# Patient Record
Sex: Female | Born: 1965 | Race: Black or African American | Hispanic: No | Marital: Married | Smoking: Never smoker
Health system: Southern US, Community
[De-identification: ages and names within clinical notes are randomized; demographics above are authoritative.]

## PROBLEM LIST (undated history)

## (undated) DIAGNOSIS — K635 Polyp of colon: Secondary | ICD-10-CM

## (undated) DIAGNOSIS — K219 Gastro-esophageal reflux disease without esophagitis: Secondary | ICD-10-CM

## (undated) HISTORY — PX: HERNIA REPAIR: SHX51

## (undated) HISTORY — PX: BREAST REDUCTION SURGERY: SHX8

## (undated) HISTORY — DX: Polyp of colon: K63.5

## (undated) HISTORY — PX: ABDOMINAL HYSTERECTOMY: SHX81

## (undated) HISTORY — PX: CHOLECYSTECTOMY: SHX55

## (undated) HISTORY — DX: Gastro-esophageal reflux disease without esophagitis: K21.9

---

## 2019-10-23 ENCOUNTER — Other Ambulatory Visit: Payer: Self-pay | Admitting: Nurse Practitioner

## 2019-10-23 DIAGNOSIS — M8580 Other specified disorders of bone density and structure, unspecified site: Secondary | ICD-10-CM

## 2019-10-26 ENCOUNTER — Other Ambulatory Visit: Payer: Self-pay | Admitting: Nurse Practitioner

## 2019-10-26 DIAGNOSIS — N644 Mastodynia: Secondary | ICD-10-CM

## 2019-11-09 ENCOUNTER — Ambulatory Visit
Admission: RE | Admit: 2019-11-09 | Discharge: 2019-11-09 | Disposition: A | Discharge status: Home / Self Care | Payer: Managed Care, Other (non HMO) | Source: Ambulatory Visit | Attending: Nurse Practitioner | Admitting: Nurse Practitioner

## 2019-11-09 ENCOUNTER — Other Ambulatory Visit: Payer: Self-pay

## 2019-11-09 DIAGNOSIS — N644 Mastodynia: Secondary | ICD-10-CM

## 2019-12-10 ENCOUNTER — Ambulatory Visit
Admission: RE | Admit: 2019-12-10 | Discharge: 2019-12-10 | Disposition: A | Discharge status: Home / Self Care | Payer: Managed Care, Other (non HMO) | Source: Ambulatory Visit | Attending: Nurse Practitioner | Admitting: Nurse Practitioner

## 2019-12-10 ENCOUNTER — Other Ambulatory Visit: Payer: Self-pay

## 2019-12-10 DIAGNOSIS — M8580 Other specified disorders of bone density and structure, unspecified site: Secondary | ICD-10-CM

## 2019-12-10 DIAGNOSIS — M85851 Other specified disorders of bone density and structure, right thigh: Secondary | ICD-10-CM | POA: Insufficient documentation

## 2020-03-04 ENCOUNTER — Other Ambulatory Visit: Payer: Self-pay | Admitting: Nurse Practitioner

## 2020-03-04 DIAGNOSIS — N644 Mastodynia: Secondary | ICD-10-CM

## 2020-03-13 ENCOUNTER — Ambulatory Visit
Admission: RE | Admit: 2020-03-13 | Discharge: 2020-03-13 | Disposition: A | Discharge status: Home / Self Care | Payer: PRIVATE HEALTH INSURANCE | Source: Ambulatory Visit | Attending: Nurse Practitioner | Admitting: Nurse Practitioner

## 2020-03-13 ENCOUNTER — Ambulatory Visit: Payer: Managed Care, Other (non HMO)

## 2020-03-13 ENCOUNTER — Other Ambulatory Visit: Payer: Self-pay

## 2020-03-13 DIAGNOSIS — N644 Mastodynia: Secondary | ICD-10-CM

## 2020-03-19 ENCOUNTER — Other Ambulatory Visit: Payer: Managed Care, Other (non HMO)

## 2020-04-22 DIAGNOSIS — Z8249 Family history of ischemic heart disease and other diseases of the circulatory system: Secondary | ICD-10-CM | POA: Insufficient documentation

## 2020-04-22 DIAGNOSIS — I10 Essential (primary) hypertension: Secondary | ICD-10-CM | POA: Insufficient documentation

## 2020-05-21 DIAGNOSIS — K219 Gastro-esophageal reflux disease without esophagitis: Secondary | ICD-10-CM | POA: Insufficient documentation

## 2020-05-21 DIAGNOSIS — J3089 Other allergic rhinitis: Secondary | ICD-10-CM | POA: Insufficient documentation

## 2020-05-29 ENCOUNTER — Ambulatory Visit: Payer: Managed Care, Other (non HMO) | Admitting: Family Medicine

## 2020-05-29 DIAGNOSIS — R0789 Other chest pain: Secondary | ICD-10-CM | POA: Insufficient documentation

## 2020-06-02 DIAGNOSIS — R221 Localized swelling, mass and lump, neck: Secondary | ICD-10-CM | POA: Insufficient documentation

## 2020-06-02 DIAGNOSIS — Z6837 Body mass index (BMI) 37.0-37.9, adult: Secondary | ICD-10-CM | POA: Insufficient documentation

## 2020-06-02 DIAGNOSIS — E559 Vitamin D deficiency, unspecified: Secondary | ICD-10-CM | POA: Insufficient documentation

## 2020-10-07 ENCOUNTER — Other Ambulatory Visit: Payer: Self-pay | Admitting: Otolaryngology

## 2020-10-07 ENCOUNTER — Other Ambulatory Visit: Payer: Self-pay | Admitting: Physician Assistant

## 2020-10-07 DIAGNOSIS — Z1231 Encounter for screening mammogram for malignant neoplasm of breast: Secondary | ICD-10-CM

## 2020-11-17 ENCOUNTER — Ambulatory Visit
Admission: RE | Admit: 2020-11-17 | Discharge: 2020-11-17 | Disposition: A | Discharge status: Home / Self Care | Payer: PRIVATE HEALTH INSURANCE | Source: Ambulatory Visit | Attending: Physician Assistant | Admitting: Physician Assistant

## 2020-11-17 ENCOUNTER — Other Ambulatory Visit: Payer: Self-pay

## 2020-11-17 DIAGNOSIS — Z1231 Encounter for screening mammogram for malignant neoplasm of breast: Secondary | ICD-10-CM

## 2020-11-24 ENCOUNTER — Other Ambulatory Visit: Payer: Self-pay | Admitting: Physician Assistant

## 2020-11-24 DIAGNOSIS — R928 Other abnormal and inconclusive findings on diagnostic imaging of breast: Secondary | ICD-10-CM

## 2020-12-05 ENCOUNTER — Ambulatory Visit
Admission: RE | Admit: 2020-12-05 | Discharge: 2020-12-05 | Disposition: A | Discharge status: Home / Self Care | Payer: PRIVATE HEALTH INSURANCE | Source: Ambulatory Visit | Attending: Physician Assistant | Admitting: Physician Assistant

## 2020-12-05 ENCOUNTER — Other Ambulatory Visit: Payer: Self-pay

## 2020-12-05 DIAGNOSIS — R928 Other abnormal and inconclusive findings on diagnostic imaging of breast: Secondary | ICD-10-CM

## 2021-03-13 ENCOUNTER — Telehealth: Payer: Self-pay

## 2021-03-13 NOTE — Telephone Encounter (Signed)
Referral notes sent from [facility], Phone #: [phone], Fax #: [phone]   Notes sent to scheduling

## 2021-03-16 ENCOUNTER — Telehealth: Payer: Self-pay

## 2021-03-16 NOTE — Telephone Encounter (Signed)
NOTES ON [place], SENT REFERRAL TO SCHEDULING

## 2021-04-24 ENCOUNTER — Other Ambulatory Visit: Payer: Self-pay

## 2021-04-24 DIAGNOSIS — E65 Localized adiposity: Secondary | ICD-10-CM | POA: Insufficient documentation

## 2021-04-27 ENCOUNTER — Encounter: Payer: Self-pay | Admitting: Gastroenterology

## 2021-04-27 ENCOUNTER — Ambulatory Visit (INDEPENDENT_AMBULATORY_CARE_PROVIDER_SITE_OTHER): Payer: 59 | Admitting: Gastroenterology

## 2021-04-27 VITALS — BP 124/68 | HR 64 | Ht 61.0 in | Wt 198.6 lb

## 2021-04-27 DIAGNOSIS — Z86010 Personal history of colonic polyps: Secondary | ICD-10-CM

## 2021-04-27 DIAGNOSIS — R12 Heartburn: Secondary | ICD-10-CM

## 2021-04-27 DIAGNOSIS — Z800 Family history of malignant neoplasm of digestive organs: Secondary | ICD-10-CM

## 2021-04-27 NOTE — Progress Notes (Addendum)
[facility] Consult Note:  History: Michelle Larsen 04/27/2021  Referring provider: Daphane Shepherd, PA  Reason for consult/chief complaint: Gastroesophageal Reflux (New to the area, here to establish care)   Subjective  HPI:  This is a very pleasant 55 year old woman referred by her new primary care provider for chronic upper digestive symptoms and history of colon polyps. She was living in [name] about 15 years ago when she had chronic abdominal pain and saw a GI physician.  EGD and colonoscopy reportedly done (no records) and she was started on Dexilant 60 mg daily at that time.  It is difficult for her to recall that far back, but she believes that she was probably having some heartburn at the time.  She was ultimately diagnosed with intra-abdominal adhesions and the pain resolved after surgery for that.  She remained on the [place] and says that every time it was stopped she would get recurrent heartburn.  The physician in [place], [place] was able to eventually get her down to 30 mg a day and she has remained on that for years.  She had a subsequent EGD sometime in [place]. [name] denies heartburn dysphagia odynophagia, nausea or vomiting.  Her bowel habits are regular without rectal bleeding.  Michelle Larsen on 03/05/2019: Reported history of colon polyps (unknown histology) on last colonoscopy more than 5 years prior, and unspecified family history colon cancer Small external hemorrhoids noted.  8 to 10 mm sigmoid colon sessile polyp removed with a hot snare (no pathology report with these records).  Sigmoid diverticulosis. ROS:  Review of Systems She denies chest pain dyspnea or dysuria  Past Medical History: Past Medical History:  Diagnosis Date  . Colon polyp   . GERD (gastroesophageal reflux disease)      Past Surgical History: Past Surgical History:  Procedure Laterality Date  . ABDOMINAL  HYSTERECTOMY    . BREAST REDUCTION SURGERY    . CESAREAN SECTION    . CHOLECYSTECTOMY    . HERNIA REPAIR     x2     Family History: Family History  Problem Relation Age of Onset  . Hypertension Mother   . Hypertension Father   . Colon cancer Father   . Other Father        Michelle Larsen   Father was diagnosed with colon cancer in his late 43s and did not previously been screened.  Social History: Social History   Socioeconomic History  . Marital status: Married    Spouse name: Not on file  . Number of children: Not on file  . Years of education: Not on file  . Highest education level: Not on file  Occupational History  . Not on file  Tobacco Use  . Smoking status: Never Smoker  . Smokeless tobacco: Never Used  Vaping Use  . Vaping Use: Never used  Substance and Sexual Activity  . Alcohol use: Not Currently  . Drug use: Never  . Sexual activity: Not on file  Other Topics Concern  . Not on file  Social History Narrative  . Not on file   Social Determinants of Health   Financial Resource Strain: Not on file  Food Insecurity: Not on file  Transportation Needs: Not on file  Physical Activity: Not on file  Stress: Not on file  Social Connections: Not on file    Allergies: Allergies  Allergen Reactions  . Sulfur Other (See Comments)    Sulfa antibiotics,  HCTZ  . Ceftin [Cefuroxime] Rash  . Sulfa Antibiotics Rash    Outpatient Meds: Current Outpatient Medications  Medication Sig Dispense Refill  . Dexlansoprazole (DEXILANT) 30 MG capsule Take 30 mg by mouth daily.    Marland Kitchen DIGESTIVE ENZYMES PO Take by mouth.    . Lactobacillus (PROBIOTIC ACIDOPHILUS PO) Take by mouth.    Marland Kitchen lisinopril (ZESTRIL) 20 MG tablet Take 1 tablet by mouth daily.     No current facility-administered medications for this visit.      ___________________________________________________________________ Objective   Exam:  BP 124/68   Pulse 64   Ht 5\' 1"  (1.549 m)   Wt 198 lb 9.6  oz (90.1 kg)   BMI 37.53 kg/m  Wt Readings from Last 3 Encounters:  04/27/21 198 lb 9.6 oz (90.1 kg)     General: Well-appearing, normal vocal  Eyes: sclera anicteric, no redness  ENT: oral mucosa moist without lesions, no cervical or supraclavicular lymphadenopathy  CV: RRR without murmur, S1/S2, no JVD, no peripheral edema  Resp: clear to auscultation bilaterally, normal RR and effort noted  GI: soft, no tenderness, with active bowel sounds. No guarding or palpable organomegaly noted.  Skin; warm and dry, no rash or jaundice noted  Neuro: awake, alert and oriented x 3. Normal gross motor function and fluent speech   Assessment: Encounter Diagnoses  Name Primary?  . History of colonic polyps Yes  . Family history of colon cancer in father   . Heartburn     Regarding her history of polyp and family history of colon cancer, it would be helpful to get the pathology report from her last procedure to know the specific type and exact size of polyp removed.  We will put her in for a 5-year recall for now, and it can be changed if necessary after receipt and review of the pathology report. Release of information form signed.  Regarding her chronic PPI use, it is not clear that it was initially started for GERD, though those records are currently available.  We discussed how patients can get rebound heartburn if long-term use of this medicine is stopped abruptly. While the long-term risks are uncertain, postmenopausal women could be at risk for impaired calcium absorption and bone loss, and she already has osteopenia.  Therefore, I recommended she decrease the Dexilant to 1 tablet every other day and send me a message by portal or call my nurse in about 2 weeks with an update.  We can then discuss further weaning of the medication afterward depending on how she is feeling.  Even changing to H2 blocker would be a reasonable goal if we cannot get her off acid suppression  altogether   Thank you for the courtesy of this consult.  Please call me with any questions or concerns.  Michelle Larsen  CC: Referring provider noted above  Addendum:  Records from prior endoscopist Michelle Larsen) include pathology report. -  tubular adenoma  Change recall to 3 years  - H. Loletha Carrow, MD

## 2021-04-27 NOTE — Patient Instructions (Signed)
If you are age 57 or older, your body mass index should be between 23-30. Your Body mass index is 37.53 kg/m. If this is out of the aforementioned range listed, please consider follow up with your Primary Care Provider.  If you are age 67 or younger, your body mass index should be between 19-25. Your Body mass index is 37.53 kg/m. If this is out of the aformentioned range listed, please consider follow up with your Primary Care Provider.   It was a pleasure to see you today!  Thank you for trusting me with your gastrointestinal care!

## 2021-06-03 DIAGNOSIS — Z86010 Personal history of colonic polyps: Secondary | ICD-10-CM | POA: Insufficient documentation

## 2021-06-03 DIAGNOSIS — Z800 Family history of malignant neoplasm of digestive organs: Secondary | ICD-10-CM | POA: Insufficient documentation

## 2021-06-03 DIAGNOSIS — D1803 Hemangioma of intra-abdominal structures: Secondary | ICD-10-CM | POA: Insufficient documentation

## 2021-07-10 ENCOUNTER — Other Ambulatory Visit: Payer: Self-pay

## 2021-07-10 ENCOUNTER — Ambulatory Visit (INDEPENDENT_AMBULATORY_CARE_PROVIDER_SITE_OTHER): Payer: 59 | Admitting: Cardiovascular Disease

## 2021-07-10 ENCOUNTER — Encounter (HOSPITAL_BASED_OUTPATIENT_CLINIC_OR_DEPARTMENT_OTHER): Payer: Self-pay | Admitting: Cardiovascular Disease

## 2021-07-10 DIAGNOSIS — Z6837 Body mass index (BMI) 37.0-37.9, adult: Secondary | ICD-10-CM

## 2021-07-10 DIAGNOSIS — R0602 Shortness of breath: Secondary | ICD-10-CM

## 2021-07-10 DIAGNOSIS — I10 Essential (primary) hypertension: Secondary | ICD-10-CM | POA: Diagnosis not present

## 2021-07-10 HISTORY — DX: Shortness of breath: R06.02

## 2021-07-10 MED ORDER — AMLODIPINE BESYLATE 2.5 MG PO TABS: 2.5000 mg | ORAL_TABLET | Freq: Every day | ORAL | 3 refills | Status: DC

## 2021-07-10 NOTE — Assessment & Plan Note (Signed)
Referral to PREP.

## 2021-07-10 NOTE — Progress Notes (Signed)
Cardiology Office Note:    Date:  07/10/2021   ID:  Michelle Larsen, DOB Jul 28, 1966, MRN [id number]  PCP:  Pcp, No   [facility] Providers Cardiologist:  None     Referring MD: Harrison Mons, PA   No chief complaint on file.   History of Present Illness:    Michelle Larsen is a 55 y.o. female with a hx of hypertension, GERD, and family hx of CAD, here as a referral for risk assessment.  Today, she states she is here to establish care. She was previously followed by [facility] cardi[facility] in [place], [place]. An echo about 15 years ago revealed grade 1 diastolic dysfunction. Occasionally she has palpitations and chest pain, and a recent Calcium Score was 0. Her palpitations are now occurring about once or twice a week, usually while she is lying in bed to go to sleep. At one time while working on her laptop she felt very lightheaded for a few minutes. Also, she endorses edema in all extremities. About 5 years ago she first started taking hypertension medication. She reports being allergic to HCTZ. While taking chorthalidone her blood pressure spiked and she needed to visit the ED. She was also on Norvasc at that time, which was switched to Lisinopril 5 mg. Lisinopril was later increased to 20 mg. At home her blood pressure is normally in the 120s/70-80s, and as the day progresses her systolic readings increase to the 130s. On average, her blood pressure is higher now than it was on Norvasc. In a typical day, she does not formally exercise. She states she procrastinates and has no motivation for exercise. 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 When she does exercise, she feels better with more energy. If she exerts herself too much while exercising, she will feel mild chest pain and shortness of breath. She knows she needs to lose more weight. Typically she makes her meals at home, and does not drink alcohol. She uses a probiotic and takes claritin daily. She denies any  headaches, syncope, orthopnea, or PND.   Past Medical History:  Diagnosis Date   Colon polyp    GERD (gastroesophageal reflux disease)    Shortness of breath 07/10/2021    Past Surgical History:  Procedure Laterality Date   ABDOMINAL HYSTERECTOMY     BREAST REDUCTION SURGERY     CESAREAN SECTION     CHOLECYSTECTOMY     HERNIA REPAIR     x2    Current Medications: Current Meds  Medication Sig   amLODipine (NORVASC) 2.5 MG tablet Take 1 tablet (2.5 mg total) by mouth daily.   Dexlansoprazole 30 MG capsule Take 30 mg by mouth daily.   Lactobacillus (PROBIOTIC ACIDOPHILUS PO) Take by mouth.   lisinopril (ZESTRIL) 20 MG tablet Take 1 tablet by mouth daily.   loratadine (CLARITIN) 10 MG tablet Take by mouth.     Allergies:   Sulfur, Chlorthalidone, Hctz [hydrochlorothiazide], Ceftin [cefuroxime], and Sulfa antibiotics   Social History   Socioeconomic History   Marital status: Married    Spouse name: Not on file   Number of children: Not on file   Years of education: Not on file   Highest education level: Not on file  Occupational History   Not on file  Tobacco Use   Smoking status: Never   Smokeless tobacco: Never  Vaping Use   Vaping Use: Never used  Substance and Sexual Activity   Alcohol use: Not Currently   Drug  use: Never   Sexual activity: Not on file  Other Topics Concern   Not on file  Social History Narrative   Not on file   Social Determinants of Health   Financial Resource Strain: Not on file  Food Insecurity: Not on file  Transportation Needs: Not on file  Physical Activity: Not on file  Stress: Not on file  Social Connections: Not on file     Family History: The patient's family history includes Atrial fibrillation in her mother; Colon cancer in her father; Heart disease in her paternal uncle; Heart failure in her maternal aunt and maternal uncle; Heart failure (age of onset: 59) in her maternal grandmother; Hypertension in her father and  mother; Other in her father.  ROS:   Please see the history of present illness.    (+) Palpitations (+) Chest pain (+) Lightheadedness (+) LE and UE edema (+) Exertional chest pain and shortness of breath All other systems reviewed and are negative.  EKGs/Labs/Other Studies Reviewed:    The following studies were reviewed today: No prior CV studies available.  EKG:   07/10/2021: Sinus rhythm. Rate 91 bpm. Left atrial enlargement.  Recent Labs: No results found for requested labs within last 8760 hours.  Recent Lipid Panel No results found for: CHOL, TRIG, HDL, CHOLHDL, VLDL, LDLCALC, LDLDIRECT       Physical Exam:    VS:  BP (!) 134/92 (BP Location: Left Arm, Patient Position: Sitting)   Pulse 91   Ht 5\' 1"  (1.549 m)   Wt 197 lb (89.4 kg)   SpO2 98%   BMI 37.22 kg/m  , BMI Body mass index is 37.22 kg/m. GENERAL:  Well appearing HEENT: Pupils equal round and reactive, fundi not visualized, oral mucosa unremarkable NECK:  No jugular venous distention, waveform within normal limits, carotid upstroke brisk and symmetric, no bruits LUNGS:  Clear to auscultation bilaterally HEART:  RRR.  PMI not displaced or sustained,S1 and S2 within normal limits, no S3, no S4, no clicks, no rubs, no murmurs ABD:  Flat, positive bowel sounds normal in frequency in pitch, no bruits, no rebound, no guarding, no midline pulsatile mass, no hepatomegaly, no splenomegaly EXT:  2 plus pulses throughout, no edema, no cyanosis no clubbing SKIN:  No rashes no nodules NEURO:  Cranial nerves II through XII grossly intact, motor grossly intact throughout PSYCH:  Cognitively intact, oriented to person place and time   ASSESSMENT:    1. Essential hypertension   2. BMI 37.0-37.9, adult   3. Shortness of breath    PLAN:   Essential hypertension Blood pressure is poorly controlled.  She notes that it was better when she was on amlodipine.  We will add back 2.5 mg daily.  Continue lisinopril.  She is  already doing a lot of good things like limiting her sodium intake.  We will ask her to work on increasing her exercise to at least 150 minutes a week.  We have referred her to the PREP exercise program to the [facility].  BMI 37.0-37.9, adult Referral to PREP.  Shortness of breath She has exertional dyspnea and intermittent lower extremity edema.  She attributes this to being out of shape and not exercising regularly.  However she also does have a strong family history of heart failure.  Is been over 15 years since she had an echo.  We will repeat her echo now.  Continue to work on blood pressure management as above.  Disposition: FU with PharmD in 1  month. FU with Nichoel Digiulio C. Oval Linsey, MD, [facility] in 4 months.   Medication Adjustments/Labs and Tests Ordered: Current medicines are reviewed at length with the patient today.  Concerns regarding medicines are outlined above.  Orders Placed This Encounter  Procedures   AMB Referral to [facility] Pharm-D   Amb Referral To Provider Referral Exercise Program (P.R.E.P)   ECHOCARDIOGRAM COMPLETE    Meds ordered this encounter  Medications   amLODipine (NORVASC) 2.5 MG tablet    Sig: Take 1 tablet (2.5 mg total) by mouth daily.    Dispense:  90 tablet    Refill:  3     Patient Instructions  Medication Instructions:  START AMLODIPINE 2.5 MG DAILY   *If you need a refill on your cardiac medications before your next appointment, please call your pharmacy*  Lab Work: NONE  Testing/Procedures: Your physician has requested that you have an echocardiogram. Echocardiography is a painless test that uses sound waves to create images of your heart. It provides your doctor with information about the size and shape of your heart and how well your heart's chambers and valves are working. This procedure takes approximately one hour. There are no restrictions for this procedure. [place] [address]  Follow-Up: At [facility], you  and your health needs are our priority.  As part of our continuing mission to provide you with exceptional heart care, we have created designated Provider Care Teams.  These Care Teams include your primary Cardiologist (physician) and Advanced Practice Providers (APPs -  Physician Assistants and Nurse Practitioners) who all work together to provide you with the care you need, when you need it.  We recommend signing up for the patient portal called "MyChart".  Sign up information is provided on this After Visit Summary.  MyChart is used to connect with patients for Virtual Visits (Telemedicine).  Patients are able to view lab/test results, encounter notes, upcoming appointments, etc.  Non-urgent messages can be sent to your provider as well.   To learn more about what you can do with MyChart, go to [email or web address].    Your next appointment:   4 month(s)  The format for your next appointment:   In Person  Provider:   Skeet Latch, MD or Laurann Montana, NP  Your physician recommends that you schedule a follow-up appointment in: [place] D   Other Instructions SOMEONE FROM THE [facility] PREP PROGRAM WILL BE IN TOUCH, IF YOU DO NOT HEAR IN 2 [place] UP    I,Mathew Stumpf,acting as a scribe for Skeet Latch, MD.,have documented all relevant documentation on the behalf of Skeet Latch, MD,as directed by  Skeet Latch, MD while in the presence of Skeet Latch, MD.  I, [place] Oval Linsey, MD have reviewed all documentation for this visit.  The documentation of the exam, diagnosis, procedures, and orders on 07/10/2021 are all accurate and complete.   Signed, Skeet Latch, MD  07/10/2021 5:59 PM    [facility]

## 2021-07-10 NOTE — Patient Instructions (Addendum)
Medication Instructions:  START AMLODIPINE 2.5 MG DAILY   *If you need a refill on your cardiac medications before your next appointment, please call your pharmacy*  Lab Work: NONE  Testing/Procedures: Your physician has requested that you have an echocardiogram. Echocardiography is a painless test that uses sound waves to create images of your heart. It provides your doctor with information about the size and shape of your heart and how well your heart's chambers and valves are working. This procedure takes approximately one hour. There are no restrictions for this procedure. [place] [address]  Follow-Up: At [facility], you and your health needs are our priority.  As part of our continuing mission to provide you with exceptional heart care, we have created designated Provider Care Teams.  These Care Teams include your primary Cardiologist (physician) and Advanced Practice Providers (APPs -  Physician Assistants and Nurse Practitioners) who all work together to provide you with the care you need, when you need it.  We recommend signing up for the patient portal called "MyChart".  Sign up information is provided on this After Visit Summary.  MyChart is used to connect with patients for Virtual Visits (Telemedicine).  Patients are able to view lab/test results, encounter notes, upcoming appointments, etc.  Non-urgent messages can be sent to your provider as well.   To learn more about what you can do with MyChart, go to [email or web address].    Your next appointment:   4 month(s)  The format for your next appointment:   In Person  Provider:   Skeet Latch, MD or Laurann Montana, NP  Your physician recommends that you schedule a follow-up appointment in: [address] [place] [address]   Other Instructions SOMEONE FROM THE [facility] PREP PROGRAM WILL BE IN TOUCH, IF YOU DO NOT HEAR IN 2 WEEKS CALL THE OFFICE FOR FOLLOW UP

## 2021-07-10 NOTE — Assessment & Plan Note (Signed)
Blood pressure is poorly controlled.  She notes that it was better when she was on amlodipine.  We will add back 2.5 mg daily.  Continue lisinopril.  She is already doing a lot of good things like limiting her sodium intake.  We will ask her to work on increasing her exercise to at least 150 minutes a week.  We have referred her to the PREP exercise program to the [facility].

## 2021-07-10 NOTE — Assessment & Plan Note (Signed)
She has exertional dyspnea and intermittent lower extremity edema.  She attributes this to being out of shape and not exercising regularly.  However she also does have a strong family history of heart failure.  Is been over 15 years since she had an echo.  We will repeat her echo now.  Continue to work on blood pressure management as above.

## 2021-07-15 NOTE — Addendum Note (Signed)
Addended by: Meryl Crutch on: 07/15/2021 08:29 AM   Modules accepted: Orders

## 2021-07-17 ENCOUNTER — Telehealth: Payer: Self-pay

## 2021-07-17 NOTE — Telephone Encounter (Signed)
Called to discuss PREP program, she wants to participate, prefers [facility], next class starts Aug 22, every M/W 10-11:15; will contact her early Aug to set up intake assessment appt for week of August 15.

## 2021-07-28 ENCOUNTER — Ambulatory Visit (HOSPITAL_COMMUNITY): Payer: 59 | Attending: Cardiology

## 2021-07-28 ENCOUNTER — Other Ambulatory Visit: Payer: Self-pay

## 2021-07-28 DIAGNOSIS — R0602 Shortness of breath: Secondary | ICD-10-CM | POA: Diagnosis present

## 2021-07-28 LAB — ECHOCARDIOGRAM COMPLETE
Area-P 1/2: 2.54 cm2
Calc EF: 65.2 %
S' Lateral: 2.6 cm
Single Plane A2C EF: 63.5 %
Single Plane A4C EF: 65.2 %

## 2021-08-05 ENCOUNTER — Telehealth: Payer: Self-pay

## 2021-08-05 NOTE — Telephone Encounter (Signed)
Intake assessment visit scheduled for Thursday August 18 at 11:30 at [facility]. To begin PREP classes every M/W 10-11:15 beginning August 22.

## 2021-08-11 ENCOUNTER — Other Ambulatory Visit: Payer: Self-pay

## 2021-08-11 ENCOUNTER — Ambulatory Visit (INDEPENDENT_AMBULATORY_CARE_PROVIDER_SITE_OTHER): Payer: 59 | Admitting: Pharmacist Clinician (PhC)/ Clinical Pharmacy Specialist

## 2021-08-11 DIAGNOSIS — I10 Essential (primary) hypertension: Secondary | ICD-10-CM

## 2021-08-11 NOTE — Assessment & Plan Note (Signed)
Patient with essential hypertension, doing well now on lisinopril and amlodipine.  Will have her continue with this regime for now.  Her goal is to get off medication by losing weight and will start PREP exercise program next week.  Praised her goal and advised that she let us know if her pressure drops significantly before her follow up with Dr. Oval Linsey in 3 months.

## 2021-08-11 NOTE — Progress Notes (Signed)
08/11/2021 [name] May 01, 1966 [id number]   HPI:  Michelle Larsen is a 55 y.o. female patient of Dr Oval Linsey, who presents today for advanced hypertension clinic management.  In addition to hypertension, her medical history is significant for seasonal allergies, and GERD.   She was seen by Dr. Oval Linsey last month and noted to have a pressure of 134/92.  At the time she was on lisinopril 20 mg.  Amlodipine 2.5 mg daily was added at that visit.    Today she returns for follow up.  She states that taking both the lisinopril and amlodipine at the same time causes her to feel drained/fatigued, and gives her chest pains.  She has done much better since moving the amlodipine to nights.  Has noted some lower extremity edema, but associates this with time spent outside in the heat/humidity.  Her PCP is currently slowly weaning her off dexlansoprazole, as she was recently diagnosed with osteopenia.    Blood Pressure Goal:  130/80  Current Medications: lisinopril 20 mg qam, amlodipine 2.5 mg qhs  Family Hx: both parents with late onset hypertension, both living at 42, 42, mother had short spell of AF, father had colon cancer, caught and treated; 1 sister, 2 brothers all controlled hypertension; one child, 24 with no issues  Social Hx: no tobacco, rare glass of wine,  no regular caffeine  Diet: home cooked meals, no added salt; good mix of meats and vegetables; more salads/veggies than meat, some beans for protein  Exercise: none, starts PREP next week  Home BP readings: home, 120/70's consistently throughout day since adding amlodipine  Intolerances: hctz - rash (sulfa sensitivity?), chlorthalidone caused upward spike in BP  Labs: 6/22 Na 142, BUN 13, SCr 1.01   Wt Readings from Last 3 Encounters:  08/11/21 201 lb (91.2 kg)  07/10/21 197 lb (89.4 kg)  04/27/21 198 lb 9.6 oz (90.1 kg)   BP Readings from Last 3 Encounters:  08/11/21 116/76  07/10/21 (!) 134/92  04/27/21 124/68   Pulse  Readings from Last 3 Encounters:  08/11/21 (!) 56  07/10/21 91  04/27/21 64    Current Outpatient Medications  Medication Sig Dispense Refill   amLODipine (NORVASC) 2.5 MG tablet Take 1 tablet (2.5 mg total) by mouth daily. 90 tablet 3   Dexlansoprazole 30 MG capsule Take 30 mg by mouth daily.     Lactobacillus (PROBIOTIC ACIDOPHILUS PO) Take by mouth.     lisinopril (ZESTRIL) 20 MG tablet Take 1 tablet by mouth daily.     loratadine (CLARITIN) 10 MG tablet Take by mouth.     No current facility-administered medications for this visit.    Allergies  Allergen Reactions   Sulfur Other (See Comments)    Sulfa antibiotics, HCTZ   Chlorthalidone Other (See Comments)    Elevated BP   Ceftin [Cefuroxime] Rash   Hctz [Hydrochlorothiazide] Rash   Sulfa Antibiotics Rash    Past Medical History:  Diagnosis Date   Colon polyp    GERD (gastroesophageal reflux disease)    Shortness of breath 07/10/2021    Blood pressure 116/76, pulse (!) 56, height '5\' 1"'$  (1.549 m), weight 201 lb (91.2 kg).  Essential hypertension Patient with essential hypertension, doing well now on lisinopril and amlodipine.  Will have her continue with this regime for now.  Her goal is to get off medication by losing weight and will start PREP exercise program next week.  Praised her goal and advised that she let us know if  her pressure drops significantly before her follow up with Dr. Oval Linsey in 3 months.     Tommy Medal PharmD CPP [facility] [address] [place], [place] [address] [phone]

## 2021-08-11 NOTE — Patient Instructions (Signed)
Return for a a follow up appointment November 16 with Dr. Oval Linsey at [facility] office  Check your blood pressure at home 3-4 times each week and keep record of the readings.  Take your BP meds as follows:  Continue with lisinopril and amlodipine  Bring all of your meds, your BP cuff and your record of home blood pressures to your next appointment.  Exercise as you're able, try to walk approximately 30 minutes per day.  Keep salt intake to a minimum, especially watch canned and prepared boxed foods.  Eat more fresh fruits and vegetables and fewer canned items.  Avoid eating in fast food restaurants.    HOW TO TAKE YOUR BLOOD PRESSURE: Rest 5 minutes before taking your blood pressure.  Don't smoke or drink caffeinated beverages for at least 30 minutes before. Take your blood pressure before (not after) you eat. Sit comfortably with your back supported and both feet on the floor (don't cross your legs). Elevate your arm to heart level on a table or a desk. Use the proper sized cuff. It should fit smoothly and snugly around your bare upper arm. There should be enough room to slip a fingertip under the cuff. The bottom edge of the cuff should be 1 inch above the crease of the elbow. Ideally, take 3 measurements at one sitting and record the average.

## 2021-08-13 NOTE — Progress Notes (Signed)
[place] Report   Patient Details  Name: Michelle Larsen MRN: [id number] Date of Birth: December 19, 1966 Age: 55 y.o. PCP: Pcp, No  Vitals:   08/13/21 1206  BP: 132/78  SpO2: 99%  Weight: 199 lb 12.8 oz (90.6 kg)      [facility] Eval - 08/13/21 1200       Referral    Referring Provider HTN   Oval Linsey   Reason for referral Hypertension;Inactivity;Obesitity/Overweight    Program Start Date 08/17/21      Measurement   Waist Circumference 43 inches    Hip Circumference 47.5 inches    Body fat 44.5 percent      Information for Trainer   Goals --   Establish an exercise routine; lose 10 pounds by end of 12 wk program   Current Exercise none    Orthopedic Concerns none    Pertinent Medical History HTN    Current Barriers work      Timed Up and Go (TUGS)   Timed Up and Go Low risk <9 seconds      Mobility and Daily Activities   I find it easy to walk up or down two or more flights of stairs. 3    I have no trouble taking out the trash. 4    I do housework such as vacuuming and dusting on my own without difficulty. 4    I can easily lift a gallon of milk (8lbs). 4    I can easily walk a mile. 4    I have no trouble reaching into high cupboards or reaching down to pick up something from the floor. 4    I do not have trouble doing out-door work such as Armed forces logistics/support/administrative officer, raking leaves, or gardening. 1      Mobility and Daily Activities   I feel younger than my age. 2    I feel independent. 4    I feel energetic. 3    I live an active life.  4    I feel strong. 4    I feel healthy. 3    I feel active as other people my age. 2      How fit and strong are you.   Fit and Strong Total Score 46            Past Medical History:  Diagnosis Date   Colon polyp    GERD (gastroesophageal reflux disease)    Shortness of breath 07/10/2021   Past Surgical History:  Procedure Laterality Date   ABDOMINAL HYSTERECTOMY     BREAST REDUCTION SURGERY     CESAREAN  SECTION     CHOLECYSTECTOMY     HERNIA REPAIR     x2   Social History   Tobacco Use  Smoking Status Never  Smokeless Tobacco Never    To begin PREP Class August 22 at [facility] every M/W 10-11:15am  Yevonne Aline 08/13/2021, 12:11 PM

## 2021-08-17 NOTE — Progress Notes (Signed)
[facility] PREP Weekly Session   Patient Details  Name: Michelle Larsen MRN: [id number] Date of Birth: 01-15-1966 Age: 55 y.o. PCP: Pcp, No  There were no vitals filed for this visit.   [facility] Weekly seesion - 08/17/21 1600       Weekly Session   Topic Discussed Goal setting and welcome to the program   Introductions, review of PREP notebook and program, tour of facility, light cardio workout followed by stretching             Ellenore Roscoe B Miaa Latterell 08/17/2021, 4:06 PM

## 2021-08-24 NOTE — Progress Notes (Signed)
[facility] PREP Weekly Session   Patient Details  Name: Michelle Larsen MRN: [id number] Date of Birth: October 01, 1966 Age: 55 y.o. PCP: Pcp, No  Vitals:   08/24/21 1301  Weight: 197 lb 9.6 oz (89.6 kg)     [facility] Weekly seesion - 08/24/21 1300       Weekly Session   Topic Discussed Importance of resistance training;Other ways to be active;Water    Minutes exercised this week 145 minutes    Classes attended to date 3           Healthy habits: cardio: 150 min/wk by end of 12 weeks; strength training: 2-3 times a week, 20-40 minutes each session by end of 12 wks; water: 64 oz a day, working up to 1/2 body wt in ounces by end of 12 wks.   Zachery Dakins Rosamaria Donn 08/24/2021, 1:04 PM

## 2021-08-31 NOTE — Progress Notes (Signed)
[facility] PREP Weekly Session   Patient Details  Name: Michelle Larsen MRN: [id number] Date of Birth: 01-23-1966 Age: 55 y.o. PCP: Pcp, No  Vitals:   08/31/21 1124  Weight: 196 lb 12.8 oz (89.3 kg)     [facility] Weekly seesion - 08/31/21 1100       Weekly Session   Topic Discussed Healthy eating tips   Honey food label review   Minutes exercised this week 210 minutes    Classes attended to date [place] 08/31/2021, 11:24 AM

## 2021-09-07 NOTE — Progress Notes (Signed)
[facility] PREP Weekly Session   Patient Details  Name: Michelle Larsen MRN: [id number] Date of Birth: March 12, 1966 Age: 55 y.o. PCP: Pcp, No  Vitals:   09/07/21 1138  Weight: 197 lb 6.4 oz (89.5 kg)     [facility] Weekly seesion - 09/07/21 1100       Weekly Session   Topic Discussed Health habits;Water   Sugar demo   Minutes exercised this week 200 minutes    Classes attended to date [place] 09/07/2021, 11:40 AM

## 2021-09-14 NOTE — Progress Notes (Signed)
[facility] PREP Weekly Session   Patient Details  Name: Michelle Larsen MRN: [id number] Date of Birth: 04-Apr-1966 Age: 55 y.o. PCP: Pcp, No  Vitals:   09/14/21 1356  Weight: 196 lb (88.9 kg)     [facility] Weekly seesion - 09/14/21 1300       Weekly Session   Topic Discussed Restaurant Eating    Minutes exercised this week 240 minutes    Classes attended to date [place] 09/14/2021, 1:57 PM

## 2021-10-05 NOTE — Progress Notes (Signed)
[facility] PREP Weekly Session  Patient Details  Name: Michelle Larsen MRN: [id number] Date of Birth: 05/12/1966 Age: 55 y.o. PCP: Pcp, No  Vitals:   10/05/21 1208  Weight: 197 lb (89.4 kg)     [facility] Weekly seesion - 10/05/21 1200       [facility] "PREP" Location   [facility] "PREP" Location [facility]      Weekly Session   Topic Discussed Expectations and non-scale victories    Minutes exercised this week 330 minutes    Classes attended to date [place] 10/05/2021, 12:09 PM

## 2021-10-12 NOTE — Progress Notes (Signed)
[facility] PREP Weekly Session  Patient Details  Name: Michelle Larsen MRN: [id number] Date of Birth: 10-Jul-1966 Age: 55 y.o. PCP: Pcp, No  Vitals:   10/12/21 1127  Weight: 196 lb (88.9 kg)     [facility] Weekly seesion - 10/12/21 1100       [facility] "PREP" Location   [facility] "PREP" Location [facility]      Weekly Session   Topic Discussed Other   Portion control, [employer] visualize your portion size demo, review of nutrition labels   Minutes exercised this week 240 minutes    Classes attended to date 94           Homework for next week: bring in health food nutrition label of a food consuming in the home.   Zachery Dakins Soriya Worster 10/12/2021, 11:28 AM

## 2021-10-19 ENCOUNTER — Telehealth: Payer: Self-pay

## 2021-10-19 NOTE — Telephone Encounter (Signed)
Incorrect documentation, phone call not made, documenting for PREP class.

## 2021-10-19 NOTE — Progress Notes (Signed)
[facility] PREP Weekly Session  Patient Details  Name: Michelle Larsen MRN: [id number] Date of Birth: 1966/08/18 Age: 55 y.o. PCP: Pcp, No  Vitals:   10/19/21 1103  Weight: 195 lb 6.4 oz (88.6 kg)     [facility] Weekly seesion - 10/19/21 1100       [facility] "PREP" Location   [facility] "PREP[facility] Location [facility]      Weekly Session   Topic Discussed Finding support   Review of food labels brought in by each partcipant of program   Minutes exercised this week 210 minutes    Classes attended to date [place] 10/19/2021, 11:05 AM

## 2021-10-26 NOTE — Progress Notes (Signed)
[facility] PREP Weekly Session  Patient Details  Name: Michelle Larsen MRN: [id number] Date of Birth: [birth year] Age: 55 y.o. PCP: Pcp, No  Vitals:   10/26/21 1137  Weight: 195 lb (88.5 kg)     [facility] Weekly seesion - 10/26/21 1100       [facility] "PREP" Location   [facility] "PREP" Location [facility]      Weekly Session   Topic Discussed Calorie breakdown   Fat/Muscle comparison   Minutes exercised this week 240 minutes    Classes attended to date [place] 10/26/2021, 11:38 AM

## 2021-11-02 NOTE — Progress Notes (Signed)
[facility] PREP Weekly Session  Patient Details  Name: Michelle Larsen MRN: [id number] Date of Birth: Jun 14, 1966 Age: 55 y.o. PCP: Pcp, No  Vitals:   11/02/21 1132  Weight: 196 lb (88.9 kg)     [facility] Weekly seesion - 11/02/21 1100       [facility] "PREP" Location   [facility] "PREP" Location [facility]      Weekly Session   Topic Discussed Hitting roadblocks   Membership talk with Merrilee Seashore; reviewed goals and activity plan for next 90 days, to bring to final assessment vist Wednesday 11/16   Minutes exercised this week 255 minutes    Classes attended to date [place] 11/02/2021, 11:46 AM

## 2021-11-04 ENCOUNTER — Other Ambulatory Visit: Payer: Self-pay | Admitting: Physician Assistant

## 2021-11-04 DIAGNOSIS — Z1231 Encounter for screening mammogram for malignant neoplasm of breast: Secondary | ICD-10-CM

## 2021-11-09 NOTE — Progress Notes (Signed)
[facility] PREP Weekly Session  Patient Details  Name: Michelle Larsen MRN: [id number] Date of Birth: [birth year] Age: 55 y.o. PCP: Pcp, No  Vitals:   11/09/21 1112  Weight: 196 lb (88.9 kg)     [facility] Weekly seesion - 11/09/21 1100       [facility] "PREP" Location   [facility] "PREP" Location [facility]      Weekly Session   Topic Discussed --   How fit and strong survery completed; fit testing completed; review of final assessment visit on Wednesday to include filling out and bringing goals and activity plan for next 90 days and PREP Class survey   Minutes exercised this week 250 minutes    Classes attended to date [place] 11/09/2021, 11:14 AM

## 2021-11-11 ENCOUNTER — Ambulatory Visit (HOSPITAL_BASED_OUTPATIENT_CLINIC_OR_DEPARTMENT_OTHER): Payer: 59 | Admitting: Cardiovascular Disease

## 2021-11-11 ENCOUNTER — Ambulatory Visit (INDEPENDENT_AMBULATORY_CARE_PROVIDER_SITE_OTHER): Payer: 59 | Admitting: Cardiovascular Disease

## 2021-11-11 ENCOUNTER — Encounter (HOSPITAL_BASED_OUTPATIENT_CLINIC_OR_DEPARTMENT_OTHER): Payer: Self-pay | Admitting: Cardiovascular Disease

## 2021-11-11 ENCOUNTER — Other Ambulatory Visit: Payer: Self-pay

## 2021-11-11 DIAGNOSIS — E65 Localized adiposity: Secondary | ICD-10-CM | POA: Diagnosis not present

## 2021-11-11 DIAGNOSIS — I10 Essential (primary) hypertension: Secondary | ICD-10-CM

## 2021-11-11 NOTE — Progress Notes (Signed)
[facility] PREP Evaluation  Patient Details  Name: Michelle Larsen MRN: [id number] Date of Birth: 12/31/1965 Age: 55 y.o. PCP: Pcp, No  Vitals:   11/11/21 1050  BP: 120/80  Pulse: 78  SpO2: 99%  Weight: 196 lb (88.9 kg)     [facility] Eval - 11/11/21 1000       [facility] "PREP" Location   [facility] "PREP" Location [facility]      Referral    Program Start Date --   08/17/2021-11/11/2021     Measurement   Waist Circumference 42 inches    Hip Circumference 46.5 inches    Body fat 43.8 percent      Mobility and Daily Activities   I find it easy to walk up or down two or more flights of stairs. 3    I have no trouble taking out the trash. 4    I do housework such as vacuuming and dusting on my own without difficulty. 4    I can easily lift a gallon of milk (8lbs). 4    I can easily walk a mile. 4    I have no trouble reaching into high cupboards or reaching down to pick up something from the floor. 4    I do not have trouble doing out-door work such as Armed forces logistics/support/administrative officer, raking leaves, or gardening. 4      Mobility and Daily Activities   I feel younger than my age. 3    I feel independent. 4    I feel energetic. 3    I live an active life.  4    I feel strong. 4    I feel healthy. 3    I feel active as other people my age. 4      How fit and strong are you.   Fit and Strong Total Score 52            Past Medical History:  Diagnosis Date   Colon polyp    GERD (gastroesophageal reflux disease)    Shortness of breath 07/10/2021   Past Surgical History:  Procedure Laterality Date   ABDOMINAL HYSTERECTOMY     BREAST REDUCTION SURGERY     CESAREAN SECTION     CHOLECYSTECTOMY     HERNIA REPAIR     x2   Social History   Tobacco Use  Smoking Status Never  Smokeless Tobacco Never  End of program assessment: workouts completed: 10 Education sessions completed: 11 Total wt loss: 3.8 lbs Inches lost: 2  Ritesh Opara B Percival Glasheen 11/11/2021, 10:52 AM

## 2021-11-11 NOTE — Progress Notes (Signed)
Cardiology Office Note:    Date:  11/11/2021   ID:  Michelle Larsen, DOB 05-18-1966, MRN [id number]  PCP:  Harrison Mons, PA   [facility] Providers Cardiologist:  None     Referring MD: No ref. provider found   No chief complaint on file.   History of Present Illness:    Michelle Larsen is a 55 y.o. female with a hx of hypertension, GERD, and family hx of CAD, here for follow-up. She was initially seen 07/10/2021 as a referral for risk assessment. She was previously followed by [facility] in [place], [place]. An echo about 15 years ago revealed grade 1 diastolic dysfunction. Occasionally she has palpitations and chest pain, and a recent Calcium Score was 0. She reported palpitations once or twice per week. Her blood pressure was above goal in office but had been controlled at home. Amlodipine 2.5 mg was added.  She reported atypical chest pain but none with exertion. She also reported some shortness of breath and edema. She had an Echo 07/2021 with LVEF 60-65% and grade 1 diastolic dysfunction. RA pressure was 3 mmHG. She followed up with our pharmacy 07/2021 and blood pressure was better controlled. She was referred to the PREP exercise program.  Today, she is feeling pretty good. Since her last visit, she does not believe her Norvasc is helping. After taking it by itself, she noticed chest discomfort after waking up in the morning. She has tried stopping it, and the chest discomfort did not occur until she again restarted the Norvasc. About 2 weeks ago she discontinued her Norvasc. She has completed the PREP program, and this has motivated her to start exercising. She will start following the 5 small meal plan for her diet. She denies any palpitations, or shortness of breath. No lightheadedness, headaches, syncope, orthopnea, PND, lower extremity edema or exertional symptoms.  Prior Antihypertensives: HCTZ Chlorthalidone Amlodipine - Chest discomfort  Past Medical  History:  Diagnosis Date   Colon polyp    GERD (gastroesophageal reflux disease)    Shortness of breath 07/10/2021    Past Surgical History:  Procedure Laterality Date   ABDOMINAL HYSTERECTOMY     BREAST REDUCTION SURGERY     CESAREAN SECTION     CHOLECYSTECTOMY     HERNIA REPAIR     x2    Current Medications: Current Meds  Medication Sig   Dexlansoprazole 30 MG capsule Take 30 mg by mouth daily.   Lactobacillus (PROBIOTIC ACIDOPHILUS PO) Take by mouth.   lisinopril (ZESTRIL) 20 MG tablet Take 1 tablet by mouth daily.   loratadine (CLARITIN) 10 MG tablet Take by mouth.     Allergies:   Sulfur, Amlodipine, Chlorthalidone, Ceftin [cefuroxime], Hctz [hydrochlorothiazide], and Sulfa antibiotics   Social History   Socioeconomic History   Marital status: Married    Spouse name: Not on file   Number of children: Not on file   Years of education: Not on file   Highest education level: Not on file  Occupational History   Not on file  Tobacco Use   Smoking status: Never   Smokeless tobacco: Never  Vaping Use   Vaping Use: Never used  Substance and Sexual Activity   Alcohol use: Not Currently   Drug use: Never   Sexual activity: Not on file  Other Topics Concern   Not on file  Social History Narrative   Not on file   Social Determinants of Health   Financial Resource Strain: Not on file  Food  Insecurity: Not on file  Transportation Needs: Not on file  Physical Activity: Not on file  Stress: Not on file  Social Connections: Not on file     Family History: The patient's family history includes Atrial fibrillation in her mother; Colon cancer in her father; Heart disease in her paternal uncle; Heart failure in her maternal aunt and maternal uncle; Heart failure (age of onset: 20) in her maternal grandmother; Hypertension in her father and mother; Other in her father.  ROS:   Please see the history of present illness.    All other systems reviewed and are  negative.  EKGs/Labs/Other Studies Reviewed:    The following studies were reviewed today:  Echo 07/28/2021:  1. Left ventricular ejection fraction, by estimation, is 60 to 65%. The  left ventricle has normal function. The left ventricle has no regional  wall motion abnormalities. Left ventricular diastolic parameters are  consistent with Grade I diastolic  dysfunction (impaired relaxation). The average left ventricular global  longitudinal strain is -24.7 %. The global longitudinal strain is normal.   2. Right ventricular systolic function is normal. The right ventricular  size is normal. Tricuspid regurgitation signal is inadequate for assessing  PA pressure.   3. The mitral valve is normal in structure. No evidence of mitral valve  regurgitation. No evidence of mitral stenosis.   4. The aortic valve is tricuspid. Aortic valve regurgitation is not  visualized. No aortic stenosis is present.   5. The inferior vena cava is normal in size with greater than 50%  respiratory variability, suggesting right atrial pressure of 3 mmHg.   Calcium Score ([facility]) 06/02/2020: TOTAL CALCIUM SCORE :   0   CARDIAC ANATOMY:Normal.   VISUALIZED THORAX:The visualized mediastinum, lungs, and osseous structures are unremarkable. Hepatic cysts are noted within the visualized upper abdomen.   IMPRESSION: The total coronary calcium score is 0.   EKG:   11/11/2021: Sinus rhythm. Rate 70 bpm. 07/10/2021: Sinus rhythm. Rate 91 bpm. Left atrial enlargement.  Recent Labs: No results found for requested labs within last 8760 hours.   Recent Lipid Panel No results found for: CHOL, TRIG, HDL, CHOLHDL, VLDL, LDLCALC, LDLDIRECT       Physical Exam:    VS:  BP 120/88 (BP Location: Right Arm, Patient Position: Sitting, Cuff Size: Large)   Pulse 70  , BMI There is no height or weight on file to calculate BMI. GENERAL:  Well appearing HEENT: Pupils equal round and reactive, fundi not visualized, oral mucosa  unremarkable NECK:  No jugular venous distention, waveform within normal limits, carotid upstroke brisk and symmetric, no bruits LUNGS:  Clear to auscultation bilaterally HEART:  RRR.  PMI not displaced or sustained,S1 and S2 within normal limits, no S3, no S4, no clicks, no rubs, no murmurs ABD:  Flat, positive bowel sounds normal in frequency in pitch, no bruits, no rebound, no guarding, no midline pulsatile mass, no hepatomegaly, no splenomegaly EXT:  2 plus pulses throughout, no edema, no cyanosis no clubbing SKIN:  No rashes no nodules NEURO:  Cranial nerves II through XII grossly intact, motor grossly intact throughout PSYCH:  Cognitively intact, oriented to person place and time   ASSESSMENT:    1. Essential hypertension   2. Central obesity     PLAN:    Essential hypertension Blood pressure has been well-controlled.  She started taking amlodipine but felt poorly when taking it.  Blood pressures have been stable so she will just continue on lisinopril.  Continue  with regular exercise.  Central obesity She is congratulated on completing the PR EP program the [facility].  She enjoyed it and is not exercising regularly.  She was encouraged to keep getting at least 150 minutes of exercise weekly.  She will come back for fasting lipids and a CMP.   Disposition: FU with Tarl Cephas C. Oval Linsey, MD, [facility] in 1 year.   Medication Adjustments/Labs and Tests Ordered: Current medicines are reviewed at length with the patient today.  Concerns regarding medicines are outlined above.   Orders Placed This Encounter  Procedures   Lipid panel   Comprehensive metabolic panel   EKG 11-DBZM    No orders of the defined types were placed in this encounter.  Patient Instructions  Medication Instructions:  Your physician recommends that you continue on your current medications as directed. Please refer to the Current Medication list given to you today.   *If you need a refill on your cardiac  medications before your next appointment, please call your pharmacy*  Lab Work: FASTING LP/CMET SOON   Testing/Procedures: NONE  Follow-Up: At [facility], you and your health needs are our priority.  As part of our continuing mission to provide you with exceptional heart care, we have created designated Provider Care Teams.  These Care Teams include your primary Cardiologist (physician) and Advanced Practice Providers (APPs -  Physician Assistants and Nurse Practitioners) who all work together to provide you with the care you need, when you need it.  We recommend signing up for the patient portal called "MyChart".  Sign up information is provided on this After Visit Summary.  MyChart is used to connect with patients for Virtual Visits (Telemedicine).  Patients are able to view lab/test results, encounter notes, upcoming appointments, etc.  Non-urgent messages can be sent to your provider as well.   To learn more about what you can do with MyChart, go to [email or web address].    Your next appointment:   12 month(s)  The format for your next appointment:   In Person  Provider:   Skeet Latch, MD{     I,Mathew Stumpf,acting as a scribe for Skeet Latch, MD.,have documented all relevant documentation on the behalf of Skeet Latch, MD,as directed by  Skeet Latch, MD while in the presence of Skeet Latch, MD.  I, [place] Oval Linsey, MD have reviewed all documentation for this visit.  The documentation of the exam, diagnosis, procedures, and orders on 11/11/2021 are all accurate and complete.  Signed, Skeet Latch, MD  11/11/2021 12:47 PM    [facility]

## 2021-11-11 NOTE — Assessment & Plan Note (Signed)
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  She enjoyed it and is not exercising regularly.  She was encouraged to keep getting at least 150 minutes of exercise weekly.  She will come back for fasting lipids and a CMP.

## 2021-11-11 NOTE — Patient Instructions (Addendum)
Medication Instructions:  Your physician recommends that you continue on your current medications as directed. Please refer to the Current Medication list given to you today.   *If you need a refill on your cardiac medications before your next appointment, please call your pharmacy*  Lab Work: FASTING LP/CMET SOON   Testing/Procedures: NONE  Follow-Up: At [facility], you and your health needs are our priority.  As part of our continuing mission to provide you with exceptional heart care, we have created designated Provider Care Teams.  These Care Teams include your primary Cardiologist (physician) and Advanced Practice Providers (APPs -  Physician Assistants and Nurse Practitioners) who all work together to provide you with the care you need, when you need it.  We recommend signing up for the patient portal called "MyChart".  Sign up information is provided on this After Visit Summary.  MyChart is used to connect with patients for Virtual Visits (Telemedicine).  Patients are able to view lab/test results, encounter notes, upcoming appointments, etc.  Non-urgent messages can be sent to your provider as well.   To learn more about what you can do with MyChart, go to [email or web address].    Your next appointment:   12 month(s)  The format for your next appointment:   In Person  Provider:   Tiffany [employer], MD          

## 2021-11-11 NOTE — Assessment & Plan Note (Signed)
Blood pressure has been well-controlled.  She started taking amlodipine but felt poorly when taking it.  Blood pressures have been stable so she will just continue on lisinopril.  Continue with regular exercise.

## 2021-11-12 LAB — LIPID PANEL
Chol/HDL Ratio: 4.4 ratio (ref 0.0–4.4)
Cholesterol, Total: 180 mg/dL (ref 100–199)
HDL: 41 mg/dL (ref >39)
LDL Chol Calc (NIH): 121 mg/dL — ABNORMAL HIGH (ref 0–99)
Triglycerides: 99 mg/dL (ref 0–149)
VLDL Cholesterol Cal: 18 mg/dL (ref 5–40)

## 2021-11-12 LAB — COMPREHENSIVE METABOLIC PANEL
ALT: 9 IU/L (ref 0–32)
AST: 13 IU/L (ref 0–40)
Albumin/Globulin Ratio: 1.8 (ref 1.2–2.2)
Albumin: 4.4 g/dL (ref 3.8–4.9)
Alkaline Phosphatase: 87 IU/L (ref 44–121)
BUN/Creatinine Ratio: 11 (ref 9–23)
BUN: 12 mg/dL (ref 6–24)
Bilirubin Total: 0.3 mg/dL (ref 0.0–1.2)
CO2: 21 mmol/L (ref 20–29)
Calcium: 9.1 mg/dL (ref 8.7–10.2)
Chloride: 107 mmol/L — ABNORMAL HIGH (ref 96–106)
Creatinine, Ser: 1.11 mg/dL — ABNORMAL HIGH (ref 0.57–1.00)
Globulin, Total: 2.5 g/dL (ref 1.5–4.5)
Glucose: 93 mg/dL (ref 70–99)
Potassium: 5.2 mmol/L (ref 3.5–5.2)
Sodium: 145 mmol/L — ABNORMAL HIGH (ref 134–144)
Total Protein: 6.9 g/dL (ref 6.0–8.5)
eGFR: 58 mL/min/{1.73_m2} — ABNORMAL LOW (ref >59)

## 2021-12-08 ENCOUNTER — Ambulatory Visit
Admission: RE | Admit: 2021-12-08 | Discharge: 2021-12-08 | Disposition: A | Discharge status: Home / Self Care | Payer: No Typology Code available for payment source | Source: Ambulatory Visit | Attending: Physician Assistant | Admitting: Physician Assistant

## 2021-12-08 DIAGNOSIS — Z1231 Encounter for screening mammogram for malignant neoplasm of breast: Secondary | ICD-10-CM

## 2022-02-21 ENCOUNTER — Encounter (HOSPITAL_BASED_OUTPATIENT_CLINIC_OR_DEPARTMENT_OTHER): Payer: Self-pay | Admitting: Emergency Medicine

## 2022-02-21 ENCOUNTER — Other Ambulatory Visit: Payer: Self-pay

## 2022-02-21 ENCOUNTER — Emergency Department (HOSPITAL_BASED_OUTPATIENT_CLINIC_OR_DEPARTMENT_OTHER)
Admission: EM | Admit: 2022-02-21 | Discharge: 2022-02-21 | Disposition: A | Discharge status: Home / Self Care | Payer: No Typology Code available for payment source | Attending: Emergency Medicine | Admitting: Emergency Medicine

## 2022-02-21 ENCOUNTER — Emergency Department (HOSPITAL_BASED_OUTPATIENT_CLINIC_OR_DEPARTMENT_OTHER): Payer: No Typology Code available for payment source

## 2022-02-21 DIAGNOSIS — R079 Chest pain, unspecified: Secondary | ICD-10-CM

## 2022-02-21 DIAGNOSIS — I10 Essential (primary) hypertension: Secondary | ICD-10-CM | POA: Diagnosis not present

## 2022-02-21 DIAGNOSIS — R110 Nausea: Secondary | ICD-10-CM | POA: Insufficient documentation

## 2022-02-21 DIAGNOSIS — R5383 Other fatigue: Secondary | ICD-10-CM | POA: Diagnosis not present

## 2022-02-21 DIAGNOSIS — R0789 Other chest pain: Secondary | ICD-10-CM | POA: Diagnosis present

## 2022-02-21 DIAGNOSIS — R0602 Shortness of breath: Secondary | ICD-10-CM | POA: Diagnosis not present

## 2022-02-21 DIAGNOSIS — R0781 Pleurodynia: Secondary | ICD-10-CM | POA: Diagnosis not present

## 2022-02-21 LAB — HEPATIC FUNCTION PANEL
ALT: 13 U/L (ref 0–44)
AST: 14 U/L — ABNORMAL LOW (ref 15–41)
Albumin: 4.2 g/dL (ref 3.5–5.0)
Alkaline Phosphatase: 62 U/L (ref 38–126)
Bilirubin, Direct: <0.1 mg/dL (ref 0.0–0.2)
Total Bilirubin: 0.3 mg/dL (ref 0.3–1.2)
Total Protein: 7.2 g/dL (ref 6.5–8.1)

## 2022-02-21 LAB — BASIC METABOLIC PANEL
Anion gap: 12 (ref 5–15)
BUN: 15 mg/dL (ref 6–20)
CO2: 25 mmol/L (ref 22–32)
Calcium: 9.4 mg/dL (ref 8.9–10.3)
Chloride: 104 mmol/L (ref 98–111)
Creatinine, Ser: 1.07 mg/dL — ABNORMAL HIGH (ref 0.44–1.00)
GFR, Estimated: >60 mL/min (ref >60)
Glucose, Bld: 109 mg/dL — ABNORMAL HIGH (ref 70–99)
Potassium: 3.6 mmol/L (ref 3.5–5.1)
Sodium: 141 mmol/L (ref 135–145)

## 2022-02-21 LAB — CBC
HCT: 40.2 % (ref 36.0–46.0)
Hemoglobin: 12.6 g/dL (ref 12.0–15.0)
MCH: 26.3 pg (ref 26.0–34.0)
MCHC: 31.3 g/dL (ref 30.0–36.0)
MCV: 83.8 fL (ref 80.0–100.0)
Platelets: 279 10*3/uL (ref 150–400)
RBC: 4.8 MIL/uL (ref 3.87–5.11)
RDW: 14.8 % (ref 11.5–15.5)
WBC: 8.8 10*3/uL (ref 4.0–10.5)
nRBC: 0 % (ref 0.0–0.2)

## 2022-02-21 LAB — D-DIMER, QUANTITATIVE: D-Dimer, Quant: 0.3 ug/mL-FEU (ref 0.00–0.50)

## 2022-02-21 LAB — BRAIN NATRIURETIC PEPTIDE: B Natriuretic Peptide: 18.9 pg/mL (ref 0.0–100.0)

## 2022-02-21 LAB — PREGNANCY, URINE: Preg Test, Ur: NEGATIVE

## 2022-02-21 LAB — TROPONIN I (HIGH SENSITIVITY)
Troponin I (High Sensitivity): 4 ng/L (ref <18)
Troponin I (High Sensitivity): 3 ng/L (ref <18)

## 2022-02-21 LAB — LIPASE, BLOOD: Lipase: 46 U/L (ref 11–51)

## 2022-02-21 NOTE — ED Notes (Signed)
Lab notified of add on blood work // will collect d-dimer

## 2022-02-21 NOTE — ED Provider Notes (Signed)
[facility]ERG[facility] DEPT Provider Note   CSN: [id number] Arrival date & time: 02/21/22  2031     History  Chief Complaint  Patient presents with   Chest Pain    Michelle Larsen is a 56 y.o. female.  The history is provided by the patient, the spouse and medical records. No language interpreter was used.  Chest Pain Pain location:  L chest Pain quality: aching and radiating   Pain radiates to:  L shoulder and L arm Pain severity:  Moderate Onset quality:  Gradual Duration:  2 days Timing:  Intermittent Progression:  Waxing and waning Chronicity:  Recurrent Context: not eating and not trauma   Relieved by:  Nothing Worsened by:  Deep breathing and exertion Ineffective treatments:  None tried Associated symptoms: fatigue, nausea, palpitations and shortness of breath   Associated symptoms: no abdominal pain, no altered mental status, no back pain, no cough, no diaphoresis, no dizziness, no fever, no headache, no lower extremity edema, no near-syncope, no numbness, no vomiting and no weakness       Home Medications Prior to Admission medications   Medication Sig Start Date End Date Taking? Authorizing Provider  Dexlansoprazole 30 MG capsule Take 30 mg by mouth daily.    [provider]  Lactobacillus (PROBIOTIC ACIDOPHILUS PO) Take by mouth.    [provider]  lisinopril (ZESTRIL) 20 MG tablet Take 1 tablet by mouth daily. 02/27/21   [provider]  loratadine (CLARITIN) 10 MG tablet Take by mouth. 12/28/19   [provider]      Allergies    Sulfur, Amlodipine, Chlorthalidone, Ceftin [cefuroxime], Hctz [hydrochlorothiazide], and Sulfa antibiotics    Review of Systems   Review of Systems  Constitutional:  Positive for fatigue. Negative for chills, diaphoresis and fever.  HENT:  Negative for congestion.   Respiratory:  Positive for shortness of breath. Negative for cough, chest tightness and wheezing.   Cardiovascular:   Positive for chest pain and palpitations. Negative for leg swelling and near-syncope.  Gastrointestinal:  Positive for nausea. Negative for abdominal distention, abdominal pain, constipation, diarrhea and vomiting.  Genitourinary:  Negative for dysuria and flank pain.  Musculoskeletal:  Negative for back pain, neck pain and neck stiffness.  Skin:  Negative for rash and wound.  Neurological:  Positive for light-headedness. Negative for dizziness, weakness, numbness and headaches.  Psychiatric/Behavioral:  Negative for agitation and confusion.   All other systems reviewed and are negative.  Physical Exam Updated Vital Signs BP (!) 168/100 (BP Location: Right Arm)    Pulse 88    Temp 99.5 F (37.5 C)    Resp 18    Wt 90.7 kg    SpO2 100%    BMI 37.79 kg/m  Physical Exam Vitals and nursing note reviewed.  Constitutional:      General: She is not in acute distress.    Appearance: She is well-developed. She is not ill-appearing, toxic-appearing or diaphoretic.  HENT:     Head: Normocephalic and atraumatic.  Eyes:     Conjunctiva/sclera: Conjunctivae normal.     Pupils: Pupils are equal, round, and reactive to light.  Cardiovascular:     Rate and Rhythm: Normal rate and regular rhythm.     Heart sounds: Normal heart sounds. No murmur heard. Pulmonary:     Effort: Pulmonary effort is normal. No tachypnea or respiratory distress.     Breath sounds: Normal breath sounds. No decreased breath sounds, wheezing, rhonchi or rales.  Chest:  Chest wall: Tenderness present.  Abdominal:     Palpations: Abdomen is soft.     Tenderness: There is no abdominal tenderness.  Musculoskeletal:        General: No swelling.     Cervical back: Neck supple.     Right lower leg: No tenderness. No edema.     Left lower leg: No tenderness. No edema.  Skin:    General: Skin is warm and dry.     Capillary Refill: Capillary refill takes less than 2 seconds.  Neurological:     General: No focal deficit  present.     Mental Status: She is alert.  Psychiatric:        Mood and Affect: Mood normal. Mood is not anxious.    ED Results / Procedures / Treatments   Labs (all labs ordered are listed, but only abnormal results are displayed) Labs Reviewed  BASIC METABOLIC PANEL - Abnormal; Notable for the following components:      Result Value   Glucose, Bld 109 (*)    Creatinine, Ser 1.07 (*)    All other components within normal limits  HEPATIC FUNCTION PANEL - Abnormal; Notable for the following components:   AST 14 (*)    All other components within normal limits  CBC  PREGNANCY, URINE  LIPASE, BLOOD  D-DIMER, QUANTITATIVE  BRAIN NATRIURETIC PEPTIDE  TROPONIN I (HIGH SENSITIVITY)  TROPONIN I (HIGH SENSITIVITY)    EKG EKG Interpretation  Date/Time:  Sunday February 21 2022 20:44:05 EST Ventricular Rate:  85 PR Interval:  148 QRS Duration: 76 QT Interval:  364 QTC Calculation: 433 R Axis:   39 Text Interpretation: Normal sinus rhythm Cannot rule out Anterior infarct , age undetermined Abnormal ECG no prior ECG for comparison No STEMI Confirmed by Antony Blackbird [phone]) on 02/21/2022 9:19:10 PM  Radiology DG Chest Port 1 View  Result Date: 02/21/2022 CLINICAL DATA:  Chest pressure, nausea, left upper extremity pain and dizziness. EXAM: PORTABLE CHEST 1 VIEW COMPARISON:  None. FINDINGS: The heart size and mediastinal contours are within normal limits apart from slight aortic tortuosity. Both lungs are clear. The visualized skeletal structures are unremarkable. IMPRESSION: No evidence of acute chest disease. Electronically Signed   By: Telford Nab M.D.   On: 02/21/2022 21:35    Procedures Procedures    Medications Ordered in ED Medications - No data to display  ED Course/ Medical Decision Making/ A&P                           Medical Decision Making Amount and/or Complexity of Data Reviewed Labs: ordered. Radiology: ordered.    Michelle Larsen is a 56 y.o. female  with a past medical history significant for hypertension, previous hysterectomy, obesity, previous diastolic dysfunction, and allergies who presents with near syncope, chest pain, shortness of breath, and palpitations.  According to patient, for the last 2 days, she has had episodes of chest pressure and chest pain.  She reports that goes down her left arm and seems to get exertional.  She reports some nausea but no vomiting.  She got very lightheaded and near syncopal but did not have reported spinning dizziness.  She has felt tired but denies new medication changes.  She denies any fevers, chills, congestion, or cough.  She reports she was recently on Augmentin for sinus infection and has had some diarrhea with it but no constipation.  No other complaints reported.  EKG on arrival  does not show STEMI.  On exam, chest was slightly tender to palpation.  Lungs were clear however.  No murmur.  Good pulses in extremities.  Legs nontender and nonedematous.  She denies history of DVT or PE.  No focal neurologic deficits.  Back nontender.  Abdomen nontender.  Clinically I do feel need to rule out many etiologies of her discomfort.  With her age over 49 and pleuritic chest discomfort, I added a D-dimer to rule out thromboembolic etiology.  As patient reports history of shortness of breath, exertional pain, and history of diastolic dysfunction, will add on a BNP.  We will get screening labs and chest x-ray.  We will continue to monitor on telemetry.  Patient says that she does see Skeet Latch MD for her cardiac care and has had previous heart cath that was reassuring and calcium score that was reassuring as well.  If work-up is reassuring today, anticipate she may be candidate for discharge home with close cardiology follow-up.  If symptoms return or she has concerning findings, anticipate discussion with cardiology as she is established with Dr. Oval Linsey.  Anticipate reassessment after work-up  Work-up  returned overall reassuring.  Troponin negative x2, dimer negative, chest x-ray shows some tortuosity but otherwise no acute abnormality.  Labs otherwise reassuring.  Heart score returned at 5.  Had a shared decision made conversation about consultation with cardiology versus calling her cardiologist tomorrow given the reassuring work-up and previous reassuring work-up with cardiology.  Clinically I feel patient safe for discharge home and she agrees.  We will have her call her cardiologist tomorrow to discuss close follow-up and understands return precautions.  She had no other questions or concerns and was discharged in good condition.         Final Clinical Impression(s) / ED Diagnoses Final diagnoses:  Chest pain, unspecified type    Rx / DC Orders ED Discharge Orders     None       Clinical Impression: 1. Chest pain, unspecified type   2. Chest pain     Disposition: Discharge  Condition: Good  I have discussed the results, Dx and Tx plan with the pt(& family if present). He/she/they expressed understanding and agree(s) with the plan. Discharge instructions discussed at great length. Strict return precautions discussed and pt &/or family have verbalized understanding of the instructions. No further questions at time of discharge.    New Prescriptions   No medications on file    Follow Up: Skeet Latch, MD [address] [place] [address] [phone]     [facility] [place] [address] [phone]    [facility], [address] [place] [address] [phone]         Courtland Coppa, Gwenyth Allegra, MD 02/21/22 [phone]

## 2022-02-21 NOTE — Discharge Instructions (Signed)
Your work-up today was overall reassuring.  The heart enzymes were negative both times we checked them with the troponins, your chest x-ray is reassuring overall, and your D-dimer was negative for blood clot.  You were monitored for over 3 hours without worsening symptoms.  Your work-up was otherwise reassuring.  Given your family history, we did discuss consultation with cardiology however as you already see cardiology and had such a reassuring work-up recently, we feel you are safe to go home and then call them tomorrow to discuss further management.  That being said, if any symptoms change or worsen acutely or rapidly, please return to the nearest emergency department.  Please rest and stay hydrated.

## 2022-02-21 NOTE — ED Triage Notes (Signed)
Reports central pressure pain , nausea , left arm pain , dizziness .

## 2022-02-23 ENCOUNTER — Encounter (HOSPITAL_BASED_OUTPATIENT_CLINIC_OR_DEPARTMENT_OTHER): Payer: Self-pay | Admitting: Cardiovascular Disease

## 2022-02-23 ENCOUNTER — Other Ambulatory Visit: Payer: Self-pay

## 2022-02-23 ENCOUNTER — Ambulatory Visit (HOSPITAL_BASED_OUTPATIENT_CLINIC_OR_DEPARTMENT_OTHER): Payer: No Typology Code available for payment source | Admitting: Cardiovascular Disease

## 2022-02-23 VITALS — BP 132/86 | HR 74 | Ht 61.0 in | Wt 199.6 lb

## 2022-02-23 DIAGNOSIS — I10 Essential (primary) hypertension: Secondary | ICD-10-CM

## 2022-02-23 DIAGNOSIS — R072 Precordial pain: Secondary | ICD-10-CM

## 2022-02-23 DIAGNOSIS — R002 Palpitations: Secondary | ICD-10-CM

## 2022-02-23 DIAGNOSIS — R079 Chest pain, unspecified: Secondary | ICD-10-CM

## 2022-02-23 DIAGNOSIS — Z01812 Encounter for preprocedural laboratory examination: Secondary | ICD-10-CM | POA: Diagnosis not present

## 2022-02-23 DIAGNOSIS — R0789 Other chest pain: Secondary | ICD-10-CM

## 2022-02-23 HISTORY — DX: Palpitations: R00.2

## 2022-02-23 MED ORDER — METOPROLOL TARTRATE 50 MG PO TABS: ORAL_TABLET | ORAL | 0 refills | Status: DC

## 2022-02-23 NOTE — Assessment & Plan Note (Signed)
Episode was somewhat atypical.  I am suspicious that it may be related to stopping her PPI.  We will get a coronary CTA to assess for obstructive coronary disease.

## 2022-02-23 NOTE — Patient Instructions (Addendum)
Medication Instructions:  Your Physician recommend you continue on your current medication as directed.    Dr. Oval Linsey recommends checking blood pressure twice a day. If reading remain greater than 130/80, ok to increase Lisinopril to 40 mg daily. (Please call office to make aware)  *If you need a refill on your cardiac medications before your next appointment, please call your pharmacy*   Lab Work: Recent [facility] 02/21/22   Testing/Procedures: Cardiac CT Angiography (CTA), is a special type of CT scan that uses a computer to produce multi-dimensional views of major blood vessels throughout the body. In CT angiography, a contrast material is injected through an IV to help visualize the blood vessels [place]: Website: www.[employer]ive[employer].com/kardiamobile/  DR. Harrell Gave RECOMMENDS YOU PURCHASE  " Kardia" [employer] FROM THE  G[employer]  APP PLAY STORE.  THE APP IS FREE , BUT THE  EQUIPMENT HAS A COST. IT ALLOWS YOU TO OBTAIN A RECORDING OF YOUR HEART RATE AND RHYTHM BY PROVIDING A SHORT STRIP THAT YOU CAN SHARE WITH YOUR PROVIDER.      Follow-Up: At [facility], you and your health needs are our priority.  As part of our continuing mission to provide you with exceptional heart care, we have created designated Provider Care Teams.  These Care Teams include your primary Cardiologist (physician) and Advanced Practice Providers (APPs -  Physician Assistants and Nurse Practitioners) who all work together to provide you with the care you need, when you need it.  We recommend signing up for the patient portal called "MyChart".  Sign up information is provided on this After Visit Summary.  MyChart is used to connect with patients for Virtual Visits (Telemedicine).  Patients are able to view lab/test results, encounter notes, upcoming appointments, etc.  Non-urgent messages can be sent to your provider as well.   To learn more about what you can do with  MyChart, go to [email or web address].    Your next appointment:   6 week(s)  The format for your next appointment:   In Person  Provider:   Skeet Latch, MD or Laurann Montana, NP{    Your cardiac CT will be scheduled at one of the below locations:   [facility] [address] [place], [place] [address] [phone]  If scheduled at [facility] please arrive at the [facility] En[facility] (Entrance C2) of [facility] 30 minutes prior to test start time. You can use the FREE valet parking offered at entrance C (encouraged to control the heart rate for the test)  Proceed to the [facility] Radiology Department (first floor) to check-in and test prep.  All radiology patients and guests should use entrance C2 at [facility], accessed from [facility], even though the hospital's physical address listed is [address]    If scheduled at [facility], please arrive 15 mins early for check-in and test prep.  Please follow these instructions carefully (unless otherwise directed):  On the Night Before the Test: Be sure to Drink plenty of water. Do not consume any caffeinated/decaffeinated beverages or chocolate 12 hours prior to your test. Do not take any antihistamines 12 hours prior to your test.  On the Day of the Test: Drink plenty of water until 1 hour prior to the test. Do not eat any food 4 hours prior to the test. You may take your regular medications prior to the test.  Take metoprolol (  Lopressor) 50 mg two hours prior to test. FEMALES- please wear underwire-free bra if available, avoid dresses & tight clothing        After the Test: Drink plenty of water. After receiving IV contrast, you may experience a mild flushed feeling. This is normal. On occasion, you may experience a mild rash up to 24 hours after the test. This is not dangerous. If this occurs, you can take  Benadryl 25 mg and increase your fluid intake. If you experience trouble breathing, this can be serious. If it is severe call 911 IMMEDIATELY. If it is mild, please call our office. If you take any of these medications: Glipizide/Metformin, Avandament, Glucavance, please do not take 48 hours after completing test unless otherwise instructed.  We will call to schedule your test 2-4 weeks out understanding that some insurance companies will need an authorization prior to the service being performed.   For non-scheduling related questions, please contact the cardiac imaging nurse navigator should you have any questions/concerns: Marchia Bond, Cardiac Imaging Nurse Navigator Gordy Clement, Cardiac Imaging Nurse Navigator [facility] Direct Office Dial: [phone]   For scheduling needs, including cancellations and rescheduling, please call [place], [phone].

## 2022-02-23 NOTE — Progress Notes (Signed)
Cardiology Office Note:    Date:  02/23/2022   ID:  Michelle Larsen, DOB May 09, 1966, MRN [id number]  PCP:  Harrison Mons, PA   [facility] Providers Cardiologist:  None     Referring MD: Harrison Mons, PA   No chief complaint on file.   History of Present Illness:    Michelle Larsen is a 56 y.o. female with a hx of hypertension, GERD, and family hx of CAD, here for follow-up. She was initially seen 07/10/2021 as a referral for risk assessment. She was previously followed by [facility] cardiology in [place], [place]. An echo about 15 years ago revealed grade 1 diastolic dysfunction. Occasionally she has palpitations and chest pain, and Calcium Score 05/2020 was 0. She reported palpitations once or twice per week. Her blood pressure was above goal in office but had been controlled at home. Amlodipine 2.5 mg was added. She reported atypical chest pain but none with exertion. She also reported some shortness of breath and edema. She had an Echo 07/2021 with LVEF 60-65% and grade 1 diastolic dysfunction. RA pressure was 3 mmHG. She followed up with our pharmacy 07/2021 and blood pressure was better controlled. She was referred to the PREP exercise program.  At her last visit, she was doing well. She noticed morning chest discomfort after taking amlodipine so it was switched back to lisinopril. She had completed the PREP program, was exercising regularly, and her blood pressure was well-controlled.   She presented to the ED 01/2022 for chest pain. Starting 2 days prior, she had episodes of chest pressure and chest pain that radiated down her left arm. She reported nausea, lightheadedness, and near syncope. EKG was unremarkable. Troponin and dimer were negative. Her HEART score was 5. She was considered safe for discharge and asked to follow-up with cardiology.   Today, she reports, Saturday night, her heart started racing while watching television. The heart racing lasted for a couple of  minutes. The last episode of heart racing occurring a couple months prior. The next morning, while talking to her husband, she suddenly felt nauseous and like she would pass out. She had intermittent chest pain that radiated down her L arm. She rates the pain as a 3 out of 10. Her husband told her she should go to the emergency room. Her blood pressure in the ED was reportedly 186/100. At home, her blood pressure typically stays around 431V systolic over 40G diastolic in the morning and 867Y systolic in the afternoon. She was weaned off of her acid reflux medication. If she knows she will eat foods that may trigger her reflux, she will take a Tums or alka seltzer. She endorses frequent ear infections that she has been told is related to her adenoids. She takes Flonase for the infections and plans to see the ENT. She stays active by going to the [facility]. Her last heart catheterization was several years ago when she was still living in [place]. She denies any shortness of breath, lightheadedness, headaches, orthopnea, PND, lower extremity edema or exertional symptoms.  Prior Antihypertensives: HCTZ Chlorthalidone Amlodipine - Chest discomfort  Past Medical History:  Diagnosis Date   Colon polyp    GERD (gastroesophageal reflux disease)    Palpitations 02/23/2022   Shortness of breath 07/10/2021    Past Surgical History:  Procedure Laterality Date   ABDOMINAL HYSTERECTOMY     BREAST REDUCTION SURGERY     CESAREAN SECTION     CHOLECYSTECTOMY     HERNIA  REPAIR     x2    Current Medications: Current Meds  Medication Sig   lisinopril (ZESTRIL) 20 MG tablet Take 1 tablet by mouth daily.   loratadine (CLARITIN) 10 MG tablet Take by mouth.   metoprolol tartrate (LOPRESSOR) 50 MG tablet TAKE 1 TABLET 2 HR PRIOR TO CARDIAC PROCEDURE     Allergies:   Sulfur, Amlodipine, Chlorthalidone, Ceftin [cefuroxime], Hctz [hydrochlorothiazide], and Sulfa antibiotics   Social History   Socioeconomic  History   Marital status: Married    Spouse name: Not on file   Number of children: Not on file   Years of education: Not on file   Highest education level: Not on file  Occupational History   Not on file  Tobacco Use   Smoking status: Never   Smokeless tobacco: Never  Vaping Use   Vaping Use: Never used  Substance and Sexual Activity   Alcohol use: Not Currently   Drug use: Never   Sexual activity: Not on file  Other Topics Concern   Not on file  Social History Narrative   Not on file   Social Determinants of Health   Financial Resource Strain: Not on file  Food Insecurity: Not on file  Transportation Needs: Not on file  Physical Activity: Not on file  Stress: Not on file  Social Connections: Not on file     Family History: The patient's family history includes Atrial fibrillation in her mother; Colon cancer in her father; Heart disease in her paternal uncle; Heart failure in her maternal aunt and maternal uncle; Heart failure (age of onset: 72) in her maternal grandmother; Hypertension in her father and mother; Other in her father.  ROS:   Please see the history of present illness.    (+) Palpitations (+) Chest pain (+) Acid reflux (+) Ear infections All other systems reviewed and are negative.  EKGs/Labs/Other Studies Reviewed:    The following studies were reviewed today: Echo 07/28/2021:  1. Left ventricular ejection fraction, by estimation, is 60 to 65%. The  left ventricle has normal function. The left ventricle has no regional  wall motion abnormalities. Left ventricular diastolic parameters are  consistent with Grade I diastolic dysfunction (impaired relaxation). The average left ventricular global longitudinal strain is -24.7 %. The global longitudinal strain is normal.   2. Right ventricular systolic function is normal. The right ventricular  size is normal. Tricuspid regurgitation signal is inadequate for assessing PA pressure.   3. The mitral valve is  normal in structure. No evidence of mitral valve  regurgitation. No evidence of mitral stenosis.   4. The aortic valve is tricuspid. Aortic valve regurgitation is not  visualized. No aortic stenosis is present.   5. The inferior vena cava is normal in size with greater than 50%  respiratory variability, suggesting right atrial pressure of 3 mmHg.   Calcium Score ([facility]) 06/02/2020: TOTAL CALCIUM SCORE :   0   CARDIAC ANATOMY:Normal.   VISUALIZED THORAX:The visualized mediastinum, lungs, and osseous structures are unremarkable. Hepatic cysts are noted within the visualized upper abdomen.   IMPRESSION: The total coronary calcium score is 0.   EKG:   02/23/2022: Sinus rhythm, rate 74 bpm; non-specific T-wave abnormalities 11/11/2021: Sinus rhythm. Rate 70 bpm. 07/10/2021: Sinus rhythm. Rate 91 bpm. Left atrial enlargement.  Recent Labs: 02/21/2022: ALT 13; B Natriuretic Peptide 18.9; BUN 15; Creatinine, Ser 1.07; Hemoglobin 12.6; Platelets 279; Potassium 3.6; Sodium 141   Recent Lipid Panel    Component Value Date/Time  CHOL 180 11/12/2021 0804   TRIG 99 11/12/2021 0804   HDL 41 11/12/2021 0804   CHOLHDL 4.4 11/12/2021 0804   LDLCALC 121 (H) 11/12/2021 0804         Physical Exam:    VS:  BP 132/86 (BP Location: Left Arm, Patient Position: Sitting, Cuff Size: Large)    Pulse 74    Ht 5\' 1"  (1.549 m)    Wt 199 lb 9.6 oz (90.5 kg)    BMI 37.71 kg/m  , BMI Body mass index is 37.71 kg/m. GENERAL:  Well appearing HEENT: Pupils equal round and reactive, fundi not visualized, oral mucosa unremarkable NECK:  No jugular venous distention, waveform within normal limits, carotid upstroke brisk and symmetric, no bruits LUNGS:  Clear to auscultation bilaterally HEART:  RRR.  PMI not displaced or sustained,S1 and S2 within normal limits, no S3, no S4, no clicks, no rubs, no murmurs ABD:  Flat, positive bowel sounds normal in frequency in pitch, no bruits, no rebound, no guarding, no midline  pulsatile mass, no hepatomegaly, no splenomegaly EXT:  2 plus pulses throughout, no edema, no cyanosis no clubbing SKIN:  No rashes no nodules NEURO:  Cranial nerves II through XII grossly intact, motor grossly intact throughout PSYCH:  Cognitively intact, oriented to person place and time  ASSESSMENT:    1. Chest pain, unspecified type   2. Precordial pain   3. Pre-procedure lab exam   4. Essential hypertension   5. Palpitations   6. Chest pressure      PLAN:    Essential hypertension Blood pressure slightly above goal today.  At home she thinks it has generally been well controlled.  nHowever it was slightly elevated when she was seen in the hospital.  She is going to start checking her blood pressures at home twice a day and bring to follow-up.  If after 2 weeks her blood pressure is averaging over 130/80, we will plan to increase lisinopril to 40 mg.  Palpitations She has had sporadic episodes of palpitations.  The last episode occurred over 2 months ago.  Therefore we will not get an ambulatory monitor at this time.  Encouraged her to get a Kardia mobile device to catch the episode next time it occurs.  Chest pressure Episode was somewhat atypical.  I am suspicious that it may be related to stopping her PPI.  We will get a coronary CTA to assess for obstructive coronary disease.    Disposition: FU with Nahima Ales C. Oval Linsey, MD, [facility] in 6 weeks    Medication Adjustments/Labs and Tests Ordered: Current medicines are reviewed at length with the patient today.  Concerns regarding medicines are outlined above.   Orders Placed This Encounter  Procedures   CT CORONARY MORPH W/CTA COR W/SCORE W/CA W/CM &/OR WO/CM   EKG 12-Lead    Meds ordered this encounter  Medications   metoprolol tartrate (LOPRESSOR) 50 MG tablet    Sig: TAKE 1 TABLET 2 HR PRIOR TO CARDIAC PROCEDURE    Dispense:  1 tablet    Refill:  0    Patient Instructions  Medication Instructions:  Your  Physician recommend you continue on your current medication as directed.    Dr. Oval Linsey recommends checking blood pressure twice a day. If reading remain greater than 130/80, ok to increase Lisinopril to 40 mg daily. (Please call office to make aware)  *If you need a refill on your cardiac medications before your next appointment, please call your pharmacy*   Lab  Work: Recent [employer] 02/21/22   Testing/Procedures: Cardiac CT Angiography (CTA), is a special type of CT scan that uses a computer to produce multi-dimensional views of major blood vessels throughout the body. In CT angiography, a contrast material is injected through an IV to help visualize the blood vessels [place]: Website: www.[employer].com/kardiamobile/  DR. Harrell Gave RECOMMENDS YOU PURCHASE  " Kardia" [employer] FROM THE  G[employer]  APP PLAY STORE.  THE APP IS FREE , BUT THE  EQUIPMENT HAS A COST. IT ALLOWS YOU TO OBTAIN A RECORDING OF YOUR HEART RATE AND RHYTHM BY PROVIDING A SHORT STRIP THAT YOU CAN SHARE WITH YOUR PROVIDER.      Follow-Up: At [facility] you and your health needs are our priority.  As part of our continuing mission to provide you with exceptional heart care, we have created designated Provider Care Teams.  These Care Teams include your primary Cardiologist (physician) and Advanced Practice Providers (APPs -  Physician Assistants and Nurse Practitioners) who all work together to provide you with the care you need, when you need it.  We recommend signing up for the patient portal called "MyChart".  Sign up information is provided on this After Visit Summary.  MyChart is used to connect with patients for Virtual Visits (Telemedicine).  Patients are able to view lab/test results, encounter notes, upcoming appointments, etc.  Non-urgent messages can be sent to your provider as well.   To learn more about what you can do with MyChart, go to  [email or web address].    Your next appointment:   6 week(s)  The format for your next appointment:   In Person  Provider:   Skeet Latch, MD or Laurann Montana, NP{    Your cardiac CT will be scheduled at one of the below locations:   [facility] [address] [place], [place] [address] [phone]  If scheduled at [facility], please arrive at the [facility] and [facility]'s Entrance (Entrance C2) of [facility] 30 minutes prior to test start time. You can use the FREE valet parking offered at entrance C (encouraged to control the heart rate for the test)  Proceed to the [facility] Radiology Department (first floor) to check-in and test prep.  All radiology patients and guests should use entrance C2 at [facility], accessed from [facility], even though the hospital's physical address listed is [address].    If scheduled at [facility], please arrive 15 mins early for check-in and test prep.  Please follow these instructions carefully (unless otherwise directed):  On the Night Before the Test: Be sure to Drink plenty of water. Do not consume any caffeinated/decaffeinated beverages or chocolate 12 hours prior to your test. Do not take any antihistamines 12 hours prior to your test.  On the Day of the Test: Drink plenty of water until 1 hour prior to the test. Do not eat any food 4 hours prior to the test. You may take your regular medications prior to the test.  Take metoprolol (Lopressor) 50 mg two hours prior to test. FEMALES- please wear underwire-free bra if available, avoid dresses & tight clothing        After the Test: Drink plenty of water. After receiving IV contrast, you may experience a mild flushed feeling. This is normal. On occasion, you may experience a mild rash up to 24 hours after the test. This is not dangerous.  If this occurs, you can take Benadryl 25 mg and  increase your fluid intake. If you experience trouble breathing, this can be serious. If it is severe call 911 IMMEDIATELY. If it is mild, please call our office. If you take any of these medications: Glipizide/Metformin, Avandament, Glucavance, please do not take 48 hours after completing test unless otherwise instructed.  We will call to schedule your test 2-4 weeks out understanding that some insurance companies will need an authorization prior to the service being performed.   For non-scheduling related questions, please contact the cardiac imaging nurse navigator should you have any questions/concerns: Marchia Bond, Cardiac Imaging Nurse Navigator Gordy Clement, Cardiac Imaging Nurse Navigator [facility] Direct Office Dial: [phone]   For scheduling needs, including cancellations and rescheduling, please call [place], [phone].     I,Mykaella Javier,acting as a scribe for Skeet Latch, MD.,have documented all relevant documentation on the behalf of Skeet Latch, MD,as directed by  Skeet Latch, MD while in the presence of Skeet Latch, MD.  I, McCrory Oval Linsey, MD have reviewed all documentation for this visit.  The documentation of the exam, diagnosis, procedures, and orders on 02/23/2022 are all accurate and complete.   Signed, Skeet Latch, MD  02/23/2022 10:21 AM    [place] [facility]

## 2022-02-23 NOTE — Assessment & Plan Note (Signed)
She has had sporadic episodes of palpitations.  The last episode occurred over 2 months ago.  Therefore we will not get an ambulatory monitor at this time.  Encouraged her to get a Kardia mobile device to catch the episode next time it occurs.

## 2022-02-23 NOTE — Assessment & Plan Note (Signed)
Blood pressure slightly above goal today.  At home she thinks it has generally been well controlled.  nHowever it was slightly elevated when she was seen in the hospital.  She is going to start checking her blood pressures at home twice a day and bring to follow-up.  If after 2 weeks her blood pressure is averaging over 130/80, we will plan to increase lisinopril to 40 mg.

## 2022-03-05 ENCOUNTER — Encounter: Payer: Self-pay | Admitting: Gastroenterology

## 2022-03-05 ENCOUNTER — Other Ambulatory Visit: Payer: Self-pay

## 2022-03-05 ENCOUNTER — Telehealth (HOSPITAL_COMMUNITY): Payer: Self-pay | Admitting: *Deleted

## 2022-03-05 ENCOUNTER — Other Ambulatory Visit: Payer: Self-pay | Admitting: Gastroenterology

## 2022-03-05 MED ORDER — DEXLANSOPRAZOLE 30 MG PO CPDR: 30.0000 mg | DELAYED_RELEASE_CAPSULE | Freq: Every day | ORAL | 0 refills | Status: DC

## 2022-03-05 MED ORDER — LANSOPRAZOLE 30 MG PO CPDR: 30.0000 mg | DELAYED_RELEASE_CAPSULE | Freq: Every day | ORAL | 3 refills | Status: AC

## 2022-03-05 NOTE — Telephone Encounter (Signed)
Attempted to call patient regarding upcoming cardiac CT appointment. °Left message on voicemail with name and callback number ° °Tais Koestner RN Navigator Cardiac Imaging °[facility] °[phone] Office °[phone] Cell ° °

## 2022-03-05 NOTE — Telephone Encounter (Signed)
Please tell the patient that her insurance denied the medication due to formulary reasons. ? ?Prior authorization will typically be declined without first trying an alternate medicine. ? ?Lansoprazole 30 mg is very similar medicine to the Dexilant at equivalent dose. ? ?After letting her know about that, unless she wants to cash pay for the [place], send a 90-day prescription for the lansoprazole 30 mg once daily. ? ?HD ?

## 2022-03-05 NOTE — Telephone Encounter (Signed)
Reaching out to patient to offer assistance regarding upcoming cardiac imaging study; pt verbalizes understanding of appt date/time, parking situation and where to check in, pre-test NPO status and medications ordered, and verified current allergies; name and call back number provided for further questions should they arise ? ?Gordy Clement RN Navigator Cardiac Imaging ?[facility] ?[phone] office ?[phone] cell ? ?Patient to take '50mg'$  metoprolol tartrate two hours prior to her cardiac CT scan.  She is aware to arrive at 8:30am for her 9am scan. ?

## 2022-03-08 ENCOUNTER — Ambulatory Visit (HOSPITAL_COMMUNITY)
Admission: RE | Admit: 2022-03-08 | Discharge: 2022-03-08 | Disposition: A | Discharge status: Home / Self Care | Payer: No Typology Code available for payment source | Source: Ambulatory Visit | Attending: Cardiovascular Disease | Admitting: Cardiovascular Disease

## 2022-03-08 ENCOUNTER — Other Ambulatory Visit: Payer: Self-pay

## 2022-03-08 ENCOUNTER — Encounter (HOSPITAL_COMMUNITY): Payer: Self-pay

## 2022-03-08 DIAGNOSIS — R079 Chest pain, unspecified: Secondary | ICD-10-CM | POA: Diagnosis present

## 2022-03-08 DIAGNOSIS — R072 Precordial pain: Secondary | ICD-10-CM

## 2022-03-08 MED ORDER — IOHEXOL 350 MG/ML SOLN
100.0000 mL | Freq: Once | INTRAVENOUS | Status: AC | PRN
  Administered 2022-03-08: 100 mL via INTRAVENOUS

## 2022-03-08 MED ORDER — NITROGLYCERIN 0.4 MG SL SUBL
SUBLINGUAL_TABLET | SUBLINGUAL | Status: AC
  Filled 2022-03-08: qty 2

## 2022-03-08 MED ORDER — NITROGLYCERIN 0.4 MG SL SUBL
0.8000 mg | SUBLINGUAL_TABLET | Freq: Once | SUBLINGUAL | Status: AC
  Administered 2022-03-08: 0.8 mg via SUBLINGUAL

## 2022-03-10 ENCOUNTER — Encounter (HOSPITAL_BASED_OUTPATIENT_CLINIC_OR_DEPARTMENT_OTHER): Payer: Self-pay | Admitting: Cardiovascular Disease

## 2022-03-15 ENCOUNTER — Encounter: Payer: Self-pay | Admitting: Gastroenterology

## 2022-04-11 NOTE — Progress Notes (Signed)
? ?Office Visit  ?  ?Patient Name: Michelle Larsen ?Date of Encounter: 04/12/2022 ? ?PCP:  Harrison Mons, PA ?  ?[place]  ?Cardiologist:  Skeet Latch, MD  ?Advanced Practice Provider:  No care team member to display ?Electrophysiologist:  None  ?   ? ?Chief Complaint  ?  ?SRAH AKE is a 56 y.o. female with a hx of HTN, GERD presents today for hypertension follow-up and follow-up to cardiac CTA ? ?Past Medical History  ?  ?Past Medical History:  ?Diagnosis Date  ? Colon polyp   ? GERD (gastroesophageal reflux disease)   ? Palpitations 02/23/2022  ? Shortness of breath 07/10/2021  ? ?Past Surgical History:  ?Procedure Laterality Date  ? ABDOMINAL HYSTERECTOMY    ? BREAST REDUCTION SURGERY    ? CESAREAN SECTION    ? CHOLECYSTECTOMY    ? HERNIA REPAIR    ? x2  ? ? ?Allergies ? ?Allergies  ?Allergen Reactions  ? Sulfur Other (See Comments)  ?  Sulfa antibiotics, HCTZ  ? Amlodipine   ?  FELT POORLY   ? Chlorthalidone Other (See Comments)  ?  Elevated BP  ? Ceftin [Cefuroxime] Rash  ? Hctz [Hydrochlorothiazide] Rash  ? Sulfa Antibiotics Rash  ? ? ?History of Present Illness  ?  ?Michelle Larsen is a 56 y.o. female with a hx of HTN, GERD last seen 02/23/22 by Dr. Oval Linsey. ? ?Previously followed by Margot Chimes cardiology in [facility] Echo about 15 years ago revealed grade 1 diastolic dysfunction. Calcium score 05/2020 of 0. Repeat echo 07/2021 with LVEF 60-65%, gr1DD. She has previously been started on Amlodipine for BP. Notes intermittent palpitations but overall not bothersome at previous visits.  ? ?ED visit 01/2022 with chest pain radiating down left arm. Troponin and d-dimer negative. Last seen 02/23/22 noting morning chest discomfort with Amlodipine so transitioned back to Lisinopril. She had completed PREP program. Cardiac CTA ordered and performed 03/08/22 with coronary calcium score of 0 and mildly dilated pulmonary artery (90m). ? ?She presents today for follow up.  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  Tells me since increasing Lisinopril to 40 mg dose her blood pressure has been well controlled but notes swelling in her hands and feeling more fatigued.  She continues to have rare intermittent chest discomfort and we reviewed her cardiac CTA which she was reassured by the results are.  She presumes her chest discomfort is indigestion and this has improved since resuming Prevacid about 3 weeks ago.  She is interested in trialing alternate antihypertensive regimen though prefers to remain on 1 agent.  She is interested in trialing hydrochlorothiazide again for the diuretic effect - notes previous reaction was mild rash but not sure if that was the medication or something else at that time. ? ?Prior antihypertensives: ?HCTZ - rash (not certain if it was HCTZ or something else) ?Chlorthalidone ?Amlodipine - chest discomfort ?Lisinopril - '40mg'$  dose caused hand swelling, fatigue - tolerates '20mg'$  dose ? ?EKGs/Labs/Other Studies Reviewed:  ? ?The following studies were reviewed today: ?Echo 07/28/2021: ? 1. Left ventricular ejection fraction, by estimation, is 60 to 65%. The  ?left ventricle has normal function. The left ventricle has no regional  ?wall motion abnormalities. Left ventricular diastolic parameters are  ?consistent with Grade I diastolic dysfunction (impaired relaxation). The average left ventricular global longitudinal strain is -24.7 %. The global longitudinal strain is normal.  ? 2. Right ventricular systolic  function is normal. The right ventricular  ?size is normal. Tricuspid regurgitation signal is inadequate for assessing PA pressure.  ? 3. The mitral valve is normal in structure. No evidence of mitral valve  ?regurgitation. No evidence of mitral stenosis.  ? 4. The aortic valve is tricuspid. Aortic valve regurgitation is not  ?visualized. No aortic stenosis is present.  ? 5. The inferior vena cava is normal in size with greater than 50%  ?respiratory  variability, suggesting right atrial pressure of 3 mmHg.  ?  ?Calcium Score ([facility]) 06/02/2020: ?TOTAL CALCIUM SCORE :   0  ? ?CARDIAC ANATOMY:Normal.  ? ?VISUALIZED THORAX:The visualized mediastinum, lungs, and osseous structures are unremarkable. Hepatic cysts are noted within the visualized upper abdomen.  ? ?IMPRESSION: The total coronary calcium score is 0.  ? ?EKG:  No EKG today. ? ?Recent Labs: ?02/21/2022: ALT 13; B Natriuretic Peptide 18.9; BUN 15; Creatinine, Ser 1.07; Hemoglobin 12.6; Platelets 279; Potassium 3.6; Sodium 141  ?Recent Lipid Panel ?   ?Component Value Date/Time  ? CHOL 180 11/12/2021 0804  ? TRIG 99 11/12/2021 0804  ? HDL 41 11/12/2021 0804  ? CHOLHDL 4.4 11/12/2021 0804  ? [place] 121 (H) 11/12/2021 0804  ? ? ?Home Medications  ? ?Current Meds  ?Medication Sig  ? lansoprazole (PREVACID) 30 MG capsule Take 1 capsule (30 mg total) by mouth daily at 12 noon.  ? loratadine (CLARITIN) 10 MG tablet Take 10 mg by mouth daily as needed.  ? olmesartan-hydrochlorothiazide (BENICAR HCT) 20-12.5 MG tablet Take 1 tablet by mouth daily.  ? [DISCONTINUED] lisinopril (ZESTRIL) 20 MG tablet Take 20 mg by mouth 2 (two) times daily.  ?  ? ?Review of Systems  ?    ?All other systems reviewed and are otherwise negative except as noted above. ? ?Physical Exam  ?  ?VS:  BP 122/68   Pulse 75   Ht '5\' 1"'$  (1.549 m)   Wt 203 lb 8 oz (92.3 kg)   SpO2 100%   BMI 38.45 kg/m?  , BMI Body mass index is 38.45 kg/m?. ? ?Wt Readings from Last 3 Encounters:  ?04/12/22 203 lb 8 oz (92.3 kg)  ?02/23/22 199 lb 9.6 oz (90.5 kg)  ?02/21/22 200 lb (90.7 kg)  ?  ?GEN: Well nourished, well developed, in no acute distress. ?HEENT: normal. ?Neck: Supple, no JVD, carotid bruits, or masses. ?Cardiac: RRR, no murmurs, rubs, or gallops. No clubbing, cyanosis, edema.  Radials/PT 2+ and equal bilaterally.  ?Respiratory:  Respirations regular and unlabored, clear to auscultation bilaterally. ?GI: Soft, nontender, nondistended. ?MS: No  deformity or atrophy. ?Skin: Warm and dry, no rash. ?Neuro:  Strength and sensation are intact. ?Psych: Normal affect. ? ?Assessment & Plan  ?  ?Chest pain / GERD - 02/2022 calcium score 0.  Previous chest pain is much improved with resumption of Prevacid. ? ?HTN - BP at goal though notes difficulty with Lisinopril '40mg'$  dose with hand swelling, fatigue. She requests to trial alternate agent. After discussion of addition of Doxazosin vs Hydralazine - she prefers to remain on single pill. Will stop lisinopril and start Olmesartan-HCTZ 20-12.'5mg'$ . Did have rash previously on HCTZ though she is not sure this was related to the medication or another event at the time. She requests to trial again. BMP in 2 weeks. If tolerates and BP not at goal, can up-titrate dose in future. Check in via MyChart in 1 week.  ? ?Palpitations -quiescent and overall not bothersome.  Not beta-blocker therapy at this time.  Encouraged to stay well-hydrated, avoid caffeine, manage stress well. ? ?Disposition: Follow up in 3-4 months with Skeet Latch, MD or APP. ? ?Signed, ?Loel Dubonnet, NP ?04/12/2022, 10:40 AM ?[place] ?

## 2022-04-12 ENCOUNTER — Ambulatory Visit (HOSPITAL_BASED_OUTPATIENT_CLINIC_OR_DEPARTMENT_OTHER): Payer: No Typology Code available for payment source | Admitting: Family

## 2022-04-12 ENCOUNTER — Encounter (HOSPITAL_BASED_OUTPATIENT_CLINIC_OR_DEPARTMENT_OTHER): Payer: Self-pay | Admitting: Family

## 2022-04-12 VITALS — BP 122/68 | HR 75 | Ht 61.0 in | Wt 203.5 lb

## 2022-04-12 DIAGNOSIS — I10 Essential (primary) hypertension: Secondary | ICD-10-CM

## 2022-04-12 DIAGNOSIS — R002 Palpitations: Secondary | ICD-10-CM | POA: Diagnosis not present

## 2022-04-12 MED ORDER — OLMESARTAN MEDOXOMIL-HCTZ 20-12.5 MG PO TABS: 1.0000 | ORAL_TABLET | Freq: Every day | ORAL | 1 refills | Status: DC

## 2022-04-12 NOTE — Patient Instructions (Signed)
Medication Instructions:  ?Your physician has recommended you make the following change in your medication:  ? ?Stop: Lisinopril  ? ?Start: Olemsartan-Hydrochlorothiazide 20-12.'5mg'$  tablet daily  ? ? ?*If you need a refill on your cardiac medications before your next appointment, please call your pharmacy* ? ? ?Lab Work: ?Please return for Lab work in 2 weeks for [employer]. You may come to the...  ? ?[facility] (3rd floor) ?[address] [place], [place]  ?Open: 8am-Noon and 1pm-4:30pm  ? ?[facility] [facility] ?[place]  ? ?[employer]- Any location ? ?**no appointments needed** ? ?If you have labs (blood work) drawn today and your tests are completely normal, you will receive your results only by: ?MyChart Message (if you have MyChart) OR ?A paper copy in the mail ?If you have any lab test that is abnormal or we need to change your treatment, we will call you to review the results. ? ? ?Testing/Procedures: ?None ordered today ? ? ?Follow-Up: ?At [facility], you and your health needs are our priority.  As part of our continuing mission to provide you with exceptional heart care, we have created designated Provider Care Teams.  These Care Teams include your primary Cardiologist (physician) and Advanced Practice Providers (APPs -  Physician Assistants and Nurse Practitioners) who all work together to provide you with the care you need, when you need it. ? ?We recommend signing up for the patient portal called "MyChart".  Sign up information is provided on this After Visit Summary.  MyChart is used to connect with patients for Virtual Visits (Telemedicine).  Patients are able to view lab/test results, encounter notes, upcoming appointments, etc.  Non-urgent messages can be sent to your provider as well.   ?To learn more about what you can do with MyChart, go to [email or web address].   ? ?Your next appointment:   ?3-4 month(s) ? ?The format for your next  appointment:   ?In Person ? ?Provider:   ?Skeet Latch, MD or Laurann Montana, NP{ ? ?Other Instructions ?Please send [place] a mychart message in 1 week with an update on how you are feeling and your blood pressures ? ?Important Information About Sugar ? ? ? ? ? ? ?

## 2022-04-19 ENCOUNTER — Encounter (HOSPITAL_BASED_OUTPATIENT_CLINIC_OR_DEPARTMENT_OTHER): Payer: Self-pay

## 2022-04-19 NOTE — Telephone Encounter (Signed)
BP check in  

## 2022-04-27 ENCOUNTER — Telehealth (HOSPITAL_BASED_OUTPATIENT_CLINIC_OR_DEPARTMENT_OTHER): Payer: Self-pay

## 2022-04-27 DIAGNOSIS — I10 Essential (primary) hypertension: Secondary | ICD-10-CM

## 2022-04-27 LAB — BASIC METABOLIC PANEL
BUN/Creatinine Ratio: 20 (ref 9–23)
BUN: 24 mg/dL (ref 6–24)
CO2: 20 mmol/L (ref 20–29)
Calcium: 9.8 mg/dL (ref 8.7–10.2)
Chloride: 108 mmol/L — ABNORMAL HIGH (ref 96–106)
Creatinine, Ser: 1.22 mg/dL — ABNORMAL HIGH (ref 0.57–1.00)
Glucose: 101 mg/dL — ABNORMAL HIGH (ref 70–99)
Potassium: 4.5 mmol/L (ref 3.5–5.2)
Sodium: 149 mmol/L — ABNORMAL HIGH (ref 134–144)
eGFR: 52 mL/min/{1.73_m2} — ABNORMAL LOW (ref >59)

## 2022-04-27 NOTE — Telephone Encounter (Addendum)
Called results to patient and left results on VM (ok per DPR), instructions left to call office back if patient has any questions!  ? ? ? ?----- Message from Loel Dubonnet, NP sent at 04/27/2022  7:51 AM EDT ----- ?Kidney function slightly decreased compared to previous.  Sodium mildly elevated.  Medicated with possible dehydration.  Recommend continuing current medications and increasing hydration.  Repeat BMP in 2 weeks for monitoring. ?

## 2022-05-03 ENCOUNTER — Encounter (HOSPITAL_BASED_OUTPATIENT_CLINIC_OR_DEPARTMENT_OTHER): Payer: Self-pay

## 2022-05-04 ENCOUNTER — Other Ambulatory Visit (HOSPITAL_BASED_OUTPATIENT_CLINIC_OR_DEPARTMENT_OTHER): Payer: Self-pay | Admitting: Family

## 2022-05-04 NOTE — Telephone Encounter (Signed)
Rx(s) sent to pharmacy electronically.  

## 2022-05-11 LAB — BASIC METABOLIC PANEL
BUN/Creatinine Ratio: 15 (ref 9–23)
BUN: 17 mg/dL (ref 6–24)
CO2: 21 mmol/L (ref 20–29)
Calcium: 9.6 mg/dL (ref 8.7–10.2)
Chloride: 105 mmol/L (ref 96–106)
Creatinine, Ser: 1.14 mg/dL — ABNORMAL HIGH (ref 0.57–1.00)
Glucose: 101 mg/dL — ABNORMAL HIGH (ref 70–99)
Potassium: 5.1 mmol/L (ref 3.5–5.2)
Sodium: 144 mmol/L (ref 134–144)
eGFR: 56 mL/min/{1.73_m2} — ABNORMAL LOW (ref >59)

## 2022-06-09 ENCOUNTER — Ambulatory Visit: Payer: No Typology Code available for payment source | Admitting: Gastroenterology

## 2022-06-09 ENCOUNTER — Encounter: Payer: Self-pay | Admitting: Gastroenterology

## 2022-06-09 VITALS — BP 124/84 | HR 78 | Ht 61.0 in | Wt 200.1 lb

## 2022-06-09 DIAGNOSIS — Z86010 Personal history of colonic polyps: Secondary | ICD-10-CM | POA: Diagnosis not present

## 2022-06-09 DIAGNOSIS — K219 Gastro-esophageal reflux disease without esophagitis: Secondary | ICD-10-CM | POA: Diagnosis not present

## 2022-06-09 MED ORDER — METOCLOPRAMIDE HCL 5 MG PO TABS: ORAL_TABLET | ORAL | 0 refills | Status: DC

## 2022-06-09 MED ORDER — NA SULFATE-K SULFATE-MG SULF 17.5-3.13-1.6 GM/177ML PO SOLN: 1.0000 | Freq: Once | ORAL | 0 refills | Status: AC

## 2022-06-09 NOTE — Patient Instructions (Signed)
If you are age 58 or older, your body mass index should be between 23-30. Your Body mass index is 37.81 kg/m. If this is out of the aforementioned range listed, please consider follow up with your Primary Care Provider.  If you are age 29 or younger, your body mass index should be between 19-25. Your Body mass index is 37.81 kg/m. If this is out of the aformentioned range listed, please consider follow up with your Primary Care Provider.   ________________________________________________________  The [facility] providers would like to encourage you to use [facility] to communicate with providers for non-urgent requests or questions.  Due to long hold times on the telephone, sending your provider a message by [facility] may be a faster and more efficient way to get a response.  Please allow 48 business hours for a response.  Please remember that this is for non-urgent requests.  _______________________________________________________  Dennis Bast have been scheduled for a colonoscopy. Please follow written instructions given to you at your visit today.  Please pick up your prep supplies at the pharmacy within the next 1-3 days. If you use inhalers (even only as needed), please bring them with you on the day of your procedure.  Due to recent changes in healthcare laws, you may see the results of your imaging and laboratory studies on MyChart before your provider has had a chance to review them.  We understand that in some cases there may be results that are confusing or concerning to you. Not all laboratory results come back in the same time frame and the provider may be waiting for multiple results in order to interpret others.  Please give [place] 48 hours in order for your provider to thoroughly review all the results before contacting the office for clarification of your results.   It was a pleasure to see you today!  Thank you for trusting me with your gastrointestinal care!

## 2022-06-09 NOTE — Progress Notes (Signed)
GI Progress Note  Chief Complaint: GERD and history of colon polyp  Subjective  History: Erica follows up for her reflux and history of colon polyps.  8 to 10 mm sigmoid tubular adenoma removed on last colonoscopy at [facility] (Dr. Reece Levy).  She reportedly had colon polyps of unknown histology on a colonoscopy more than 5 years prior to that. When I met her in May 2022 she described a history of reflux symptoms with primarily heartburn requiring Dexilant 60 mg daily for over a decade, eventually weaning down to 30 mg daily.  We discussed trying to wean down or possibly discontinue the Dexilant, which she attempted to do but got recurrence of frequent heartburn and sent me a message about it a few months back.  Prevacid 30 mg daily was refilled and today's appointment arranged.  Shelbylynn says she was able to wean down to every other day, then when she stopped Prevacid, nighttime regurgitation and heartburn returned.  She is now back to taking it every day around noon time.  Supper is typically her largest meal of the day.  She denies dysphagia odynophagia, nausea vomiting or weight loss.  ROS: Cardiovascular:  no chest pain Respiratory: no dyspnea  The patient's Past Medical, Family and Social History were reviewed and are on file in the EMR.  Objective:  Med list reviewed  Current Outpatient Medications:    lansoprazole (PREVACID) 30 MG capsule, Take 1 capsule (30 mg total) by mouth daily at 12 noon., Disp: 90 capsule, Rfl: 3   loratadine (CLARITIN) 10 MG tablet, Take 10 mg by mouth daily as needed., Disp: , Rfl:    metoCLOPramide (REGLAN) 5 MG tablet, Take 1 tablet 30 minutes before bowel prep, Disp: 2 tablet, Rfl: 0   Na Sulfate-K Sulfate-Mg Sulf 17.5-3.13-1.6 GM/177ML SOLN, Take 1 kit by mouth once for 1 dose., Disp: 354 mL, Rfl: 0   olmesartan-hydrochlorothiazide (BENICAR HCT) 20-12.5 MG tablet, TAKE 1 TABLET BY MOUTH EVERY DAY, Disp: 90 tablet, Rfl: 2   Vital signs in  last 24 hrs: Vitals:   06/09/22 0813  BP: 124/84  Pulse: 78   Wt Readings from Last 3 Encounters:  06/09/22 200 lb 2 oz (90.8 kg)  04/12/22 203 lb 8 oz (92.3 kg)  02/23/22 199 lb 9.6 oz (90.5 kg)    Physical Exam  Well-appearing No additional exam, entire visit spent in discussion and record review  Labs:   ___________________________________________ Radiologic studies:   ____________________________________________ Other:   _____________________________________________ Assessment & Plan  Assessment: Encounter Diagnoses  Name Primary?   Gastroesophageal reflux disease, unspecified whether esophagitis present Yes   Personal history of colonic polyps    Longstanding reflux symptoms, at this point primarily late evening/overnight regurgitation and heartburn if she is not on PPI. We discussed the need for diet and lifestyle changes regarding attempt at moving largest meal of the day to lunch with a smaller supper, elevating head of bed, and retiming PPI to supper meal. If she does not wish to direct her meds, she may also be a candidate for TIF or laparoscopic fundoplication pending on results of further testing (typically pH/impedance and manometry)  It has been many years since her repeat upper endoscopy, and I recommended he have that done to evaluate the anatomy and see if there is esophagitis or significant hiatal hernia. She is also due for surveillance colonoscopy.  She was agreeable to both procedures after discussion of procedure and risks.  The benefits and risks of the  planned procedure were described in detail with the patient or (when appropriate) their health care proxy.  Risks were outlined as including, but not limited to, bleeding, infection, perforation, adverse medication reaction leading to cardiac or pulmonary decompensation, pancreatitis (if ERCP).  The limitation of incomplete mucosal visualization was also discussed.  No guarantees or warranties were  given.  22 minutes were spent on this encounter (including chart review, history/exam, counseling/coordination of care, and documentation) > 50% of that time was spent on counseling and coordination of care.   Nelida Meuse III

## 2022-06-17 ENCOUNTER — Encounter (HOSPITAL_BASED_OUTPATIENT_CLINIC_OR_DEPARTMENT_OTHER): Payer: Self-pay

## 2022-06-17 DIAGNOSIS — R002 Palpitations: Secondary | ICD-10-CM

## 2022-06-17 DIAGNOSIS — I10 Essential (primary) hypertension: Secondary | ICD-10-CM

## 2022-06-17 MED ORDER — OLMESARTAN MEDOXOMIL 20 MG PO TABS: 20.0000 mg | ORAL_TABLET | Freq: Every day | ORAL | 5 refills | Status: DC

## 2022-06-25 ENCOUNTER — Encounter (HOSPITAL_BASED_OUTPATIENT_CLINIC_OR_DEPARTMENT_OTHER): Payer: Self-pay

## 2022-06-25 DIAGNOSIS — I10 Essential (primary) hypertension: Secondary | ICD-10-CM

## 2022-06-25 NOTE — Telephone Encounter (Signed)
Please advise thanks.

## 2022-06-25 NOTE — Telephone Encounter (Signed)
Okay to resume Benicar (olmesartan-HCTZ) 20-12.5 mg.  I would have her get repeat BMP, magnesium, CBC Monday or Tuesday of next week.  Loel Dubonnet, NP

## 2022-07-01 LAB — CBC
Hematocrit: 36.9 % (ref 34.0–46.6)
Hemoglobin: 12.1 g/dL (ref 11.1–15.9)
MCH: 27.4 pg (ref 26.6–33.0)
MCHC: 32.8 g/dL (ref 31.5–35.7)
MCV: 84 fL (ref 79–97)
Platelets: 260 10*3/uL (ref 150–450)
RBC: 4.42 x10E6/uL (ref 3.77–5.28)
RDW: 14.6 % (ref 11.7–15.4)
WBC: 8.2 10*3/uL (ref 3.4–10.8)

## 2022-07-01 LAB — BASIC METABOLIC PANEL
BUN/Creatinine Ratio: 16 (ref 9–23)
BUN: 18 mg/dL (ref 6–24)
CO2: 21 mmol/L (ref 20–29)
Calcium: 9.3 mg/dL (ref 8.7–10.2)
Chloride: 105 mmol/L (ref 96–106)
Creatinine, Ser: 1.11 mg/dL — ABNORMAL HIGH (ref 0.57–1.00)
Glucose: 97 mg/dL (ref 70–99)
Potassium: 4.6 mmol/L (ref 3.5–5.2)
Sodium: 142 mmol/L (ref 134–144)
eGFR: 58 mL/min/{1.73_m2} — ABNORMAL LOW (ref >59)

## 2022-07-01 LAB — MAGNESIUM: Magnesium: 2.2 mg/dL (ref 1.6–2.3)

## 2022-07-05 ENCOUNTER — Encounter (HOSPITAL_BASED_OUTPATIENT_CLINIC_OR_DEPARTMENT_OTHER): Payer: Self-pay

## 2022-07-05 NOTE — Telephone Encounter (Signed)
BP log as requested 

## 2022-07-14 ENCOUNTER — Encounter: Payer: Self-pay | Admitting: Gastroenterology

## 2022-07-18 ENCOUNTER — Encounter: Payer: Self-pay | Admitting: Certified Registered Nurse Anesthetist

## 2022-07-21 ENCOUNTER — Ambulatory Visit (AMBULATORY_SURGERY_CENTER): Payer: No Typology Code available for payment source | Admitting: Gastroenterology

## 2022-07-21 ENCOUNTER — Encounter: Payer: Self-pay | Admitting: Gastroenterology

## 2022-07-21 VITALS — BP 110/62 | HR 87 | Temp 97.7°F | Resp 13

## 2022-07-21 DIAGNOSIS — Z800 Family history of malignant neoplasm of digestive organs: Secondary | ICD-10-CM

## 2022-07-21 DIAGNOSIS — Z09 Encounter for follow-up examination after completed treatment for conditions other than malignant neoplasm: Secondary | ICD-10-CM | POA: Diagnosis not present

## 2022-07-21 DIAGNOSIS — Z86010 Personal history of colonic polyps: Secondary | ICD-10-CM | POA: Diagnosis not present

## 2022-07-21 DIAGNOSIS — K219 Gastro-esophageal reflux disease without esophagitis: Secondary | ICD-10-CM | POA: Diagnosis present

## 2022-07-21 MED ORDER — SODIUM CHLORIDE 0.9 % IV SOLN: 500.0000 mL | Freq: Once | INTRAVENOUS | Status: DC

## 2022-07-21 NOTE — Progress Notes (Signed)
Pt's states no medical or surgical changes since previsit or office visit. 

## 2022-07-21 NOTE — Progress Notes (Signed)
1040 Ephedrine 10 mg given IV due to low BP, MD updated.

## 2022-07-21 NOTE — Patient Instructions (Signed)
Please read handouts provided. Continue present medications. Follow an anti-reflux regimen indefinitely. Repeat colonoscopy in 5 years for screening.   YOU HAD AN ENDOSCOPIC PROCEDURE TODAY AT [facility]:   Refer to the procedure report that was given to you for any specific questions about what was found during the examination.  If the procedure report does not answer your questions, please call your gastroenterologist to clarify.  If you requested that your care partner not be given the details of your procedure findings, then the procedure report has been included in a sealed envelope for you to review at your convenience later.  YOU SHOULD EXPECT: Some feelings of bloating in the abdomen. Passage of more gas than usual.  Walking can help get rid of the air that was put into your GI tract during the procedure and reduce the bloating. If you had a lower endoscopy (such as a colonoscopy or flexible sigmoidoscopy) you may notice spotting of blood in your stool or on the toilet paper. If you underwent a bowel prep for your procedure, you may not have a normal bowel movement for a few days.  Please Note:  You might notice some irritation and congestion in your nose or some drainage.  This is from the oxygen used during your procedure.  There is no need for concern and it should clear up in a day or so.  SYMPTOMS TO REPORT IMMEDIATELY:  Following lower endoscopy (colonoscopy or flexible sigmoidoscopy):  Excessive amounts of blood in the stool  Significant tenderness or worsening of abdominal pains  Swelling of the abdomen that is new, acute  Fever of 100F or higher  Following upper endoscopy (EGD)  Vomiting of blood or coffee ground material  New chest pain or pain under the shoulder blades  Painful or persistently difficult swallowing  New shortness of breath  Fever of 100F or higher  Black, tarry-looking stools  For urgent or emergent issues, a gastroenterologist can be  reached at any hour by calling [phone]. Do not use MyChart messaging for urgent concerns.    DIET:  We do recommend a small meal at first, but then you may proceed to your regular diet.  Drink plenty of fluids but you should avoid alcoholic beverages for 24 hours.  ACTIVITY:  You should plan to take it easy for the rest of today and you should NOT DRIVE or use heavy machinery until tomorrow (because of the sedation medicines used during the test).    FOLLOW UP: Our staff will call the number listed on your records the next business day following your procedure.  We will call around 7:15- 8:00 am to check on you and address any questions or concerns that you may have regarding the information given to you following your procedure. If we do not reach you, we will leave a message.  If you develop any symptoms (ie: fever, flu-like symptoms, shortness of breath, cough etc.) before then, please call [phone].  If you test positive for Covid 19 in the 2 weeks post procedure, please call and report this information to [place].    If any biopsies were taken you will be contacted by phone or by letter within the next 1-3 weeks.  Please call us at [phone] if you have not heard about the biopsies in 3 weeks.    SIGNATURES/CONFIDENTIALITY: You and/or your care partner have signed paperwork which will be entered into your electronic medical record.  These signatures attest to the fact that  that the information above on your After Visit Summary has been reviewed and is understood.  Full responsibility of the confidentiality of this discharge information lies with you and/or your care-partner.

## 2022-07-21 NOTE — Progress Notes (Signed)
History and Physical:  This patient presents for endoscopic testing for: Encounter Diagnoses  Name Primary?   Gastroesophageal reflux disease, unspecified whether esophagitis present Yes   Personal history of colonic polyps     Clinical details in 06/09/22 [facility] office note. No clinical changes since then. GERD and Hx colon polyps  Patient is otherwise without complaints or active issues today.   Past Medical History: Past Medical History:  Diagnosis Date   Colon polyp    GERD (gastroesophageal reflux disease)    Palpitations 02/23/2022   Shortness of breath 07/10/2021     Past Surgical History: Past Surgical History:  Procedure Laterality Date   ABDOMINAL HYSTERECTOMY     BREAST REDUCTION SURGERY     CESAREAN SECTION     CHOLECYSTECTOMY     HERNIA REPAIR     x2    Allergies: Allergies  Allergen Reactions   Sulfur Other (See Comments)    Sulfa antibiotics, HCTZ   Amlodipine     FELT POORLY    Chlorthalidone Other (See Comments)    Elevated BP   Ceftin [Cefuroxime] Rash   Sulfa Antibiotics Rash    Outpatient Meds: Current Outpatient Medications  Medication Sig Dispense Refill   lansoprazole (PREVACID) 30 MG capsule Take 1 capsule (30 mg total) by mouth daily at 12 noon. 90 capsule 3   loratadine (CLARITIN) 10 MG tablet Take 10 mg by mouth daily as needed.     metoCLOPramide (REGLAN) 5 MG tablet Take 1 tablet 30 minutes before bowel prep 2 tablet 0   olmesartan (BENICAR) 20 MG tablet Take 1 tablet (20 mg total) by mouth daily. 30 tablet 5   Current Facility-Administered Medications  Medication Dose Route Frequency Provider Last Rate Last Admin   0.9 %  sodium chloride infusion  500 mL Intravenous Once Danis, Estill Cotta III, MD          ___________________________________________________________________ Objective   Exam:  Temp 97.7 F (36.5 C)   CV: RRR without murmur, S1/S2 Resp: clear to auscultation bilaterally, normal RR and effort noted GI: soft,  no tenderness, with active bowel sounds.   Assessment: Encounter Diagnoses  Name Primary?   Gastroesophageal reflux disease, unspecified whether esophagitis present Yes   Personal history of colonic polyps      Plan: Colonoscopy EGD   The patient is appropriate for an endoscopic procedure in the ambulatory setting.   - Wilfrid Lund, MD

## 2022-07-21 NOTE — Progress Notes (Signed)
1050 HR > 100 with esmolol 25 mg given IV, MD updated, vss

## 2022-07-21 NOTE — Progress Notes (Signed)
1029 Robinul 0.1 mg IV given due large amount of secretions upon assessment.  MD made aware, vss 

## 2022-07-21 NOTE — Op Note (Signed)
[facility] Patient Name: Michelle Larsen Procedure Date: 07/21/2022 10:31 AM MRN: [id number] Endoscopist: Mallie Mussel L. Loletha Carrow , MD Age: 56 Referring MD:  Date of Birth: May 13, 1966 Gender: Female Account #: [id number] Procedure:                Upper GI endoscopy Indications:              Esophageal reflux symptoms that persist despite                            appropriate therapy Medicines:                Monitored Anesthesia Care Procedure:                Pre-Anesthesia Assessment:                           - Prior to the procedure, a History and Physical                            was performed, and patient medications and                            allergies were reviewed. The patient's tolerance of                            previous anesthesia was also reviewed. The risks                            and benefits of the procedure and the sedation                            options and risks were discussed with the patient.                            All questions were answered, and informed consent                            was obtained. Prior Anticoagulants: The patient has                            taken no previous anticoagulant or antiplatelet                            agents. ASA Grade Assessment: II - A patient with                            mild systemic disease. After reviewing the risks                            and benefits, the patient was deemed in                            satisfactory condition to undergo the procedure.  After obtaining informed consent, the endoscope was                            passed under direct vision. Throughout the                            procedure, the patient's blood pressure, pulse, and                            oxygen saturations were monitored continuously. The                            GIF [id number] #[id number] was introduced through the                            mouth, and advanced to the second part of  duodenum.                            The upper GI endoscopy was accomplished without                            difficulty. The patient tolerated the procedure                            well. Scope In: Scope Out: Findings:                 The esophagus was normal.                           The stomach was normal.                           The cardia and gastric fundus were normal on                            retroflexion. (Hill grade 2)                           The examined duodenum was normal. Complications:            No immediate complications. Estimated Blood Loss:     Estimated blood loss: none. Impression:               - Normal esophagus.                           - Normal stomach.                           - Normal examined duodenum.                           - No specimens collected. Recommendation:           - Patient has a contact number available for  emergencies. The signs and symptoms of potential                            delayed complications were discussed with the                            patient. Return to normal activities tomorrow.                            Written discharge instructions were provided to the                            patient.                           - Resume previous diet.                           - Continue present medications.                           - Follow an antireflux regimen indefinitely.                           - Do an upper GI series at appointment to be                            scheduled (evaluate anatomy and GERD) - office                            visit to follow. Working up to determine candidacy                            for TIF or surgical fundoplication. Erron Wengert L. Loletha Carrow, MD 07/21/2022 11:06:16 AM This report has been signed electronically.

## 2022-07-21 NOTE — Progress Notes (Signed)
Report given to PACU, vss 

## 2022-07-21 NOTE — Op Note (Addendum)
[facility] Patient Name: Michelle Larsen Procedure Date: 07/21/2022 10:32 AM MRN: [id number] Endoscopist: Mallie Mussel L. Loletha Carrow , MD Age: 56 Referring MD:  Date of Birth: [birth year] Gender: Female Account #: [id number] Procedure:                Colonoscopy Indications:              Surveillance: Personal history of adenomatous                            polyps on last colonoscopy 3 years ago                           (8-10 mm cecal TA March 2020 at outside practice.                            No polyps 5 yrs prior to that)                           Family Hx colon cancer Medicines:                Monitored Anesthesia Care Procedure:                Pre-Anesthesia Assessment:                           - Prior to the procedure, a History and Physical                            was performed, and patient medications and                            allergies were reviewed. The patient's tolerance of                            previous anesthesia was also reviewed. The risks                            and benefits of the procedure and the sedation                            options and risks were discussed with the patient.                            All questions were answered, and informed consent                            was obtained. Prior Anticoagulants: The patient has                            taken no previous anticoagulant or antiplatelet                            agents. ASA Grade Assessment: III - A patient with  severe systemic disease. After reviewing the risks                            and benefits, the patient was deemed in                            satisfactory condition to undergo the procedure.                           After obtaining informed consent, the colonoscope                            was passed under direct vision. Throughout the                            procedure, the patient's blood pressure, pulse, and                             oxygen saturations were monitored continuously. The                            CF [id number] #[id number] was introduced through the anus                            and advanced to the the cecum, identified by                            appendiceal orifice and ileocecal valve. The                            colonoscopy was performed without difficulty. The                            patient tolerated the procedure well. The quality                            of the bowel preparation was excellent. The                            ileocecal valve, appendiceal orifice, and rectum                            were photographed. Scope In: 10:37:05 AM Scope Out: 10:48:15 AM Scope Withdrawal Time: 0 hours 7 minutes 14 seconds  Total Procedure Duration: 0 hours 11 minutes 10 seconds  Findings:                 The perianal and digital rectal examinations were                            normal.                           Multiple diverticula were found in the left colon.  Internal hemorrhoids were found. The hemorrhoids                            were small.                           Repeat examination of right colon under NBI                            performed.                           Anal papilla(e) were hypertrophied.                           The exam was otherwise without abnormality on                            direct and retroflexion views. Complications:            No immediate complications. Estimated Blood Loss:     Estimated blood loss: none. Impression:               - Diverticulosis in the left colon.                           - Internal hemorrhoids.                           - Anal papilla(e) were hypertrophied.                           - The examination was otherwise normal on direct                            and retroflexion views.                           - No specimens collected. Recommendation:           - Patient has a contact number available for                             emergencies. The signs and symptoms of potential                            delayed complications were discussed with the                            patient. Return to normal activities tomorrow.                            Written discharge instructions were provided to the                            patient.                           -  Resume previous diet.                           - Continue present medications.                           - Repeat colonoscopy in 5 years for screening                            purposes (family Hx CRC). Denae Zulueta L. Loletha Carrow, MD 07/21/2022 10:52:03 AM This report has been signed electronically.

## 2022-07-22 ENCOUNTER — Telehealth: Payer: Self-pay | Admitting: *Deleted

## 2022-07-22 ENCOUNTER — Telehealth: Payer: Self-pay

## 2022-07-22 NOTE — Telephone Encounter (Signed)
Per 07/21/22 procedure report - Do an upper GI series at appt to be scheduled and office visit to follow.  I called and spoke with patient. Pt states that she just had her EGD and was told that everything was fine. She stated that she is not interested in any surgery and does not wish to proceed with upper GI series at this time.  Pt advised to continue her current medications. Pt verbalized understanding and had no concerns at the end of the call.

## 2022-07-22 NOTE — Telephone Encounter (Signed)
Post procedure follow up call placed, no answer and left VM.  

## 2022-07-26 NOTE — Telephone Encounter (Signed)
1 year office recall in epic.

## 2022-07-26 NOTE — Telephone Encounter (Signed)
Understood, thanks.  Please set a clinical reminder for an office visit with me in 1 year.  - HD

## 2022-08-09 ENCOUNTER — Encounter (HOSPITAL_BASED_OUTPATIENT_CLINIC_OR_DEPARTMENT_OTHER): Payer: Self-pay | Admitting: *Deleted

## 2022-08-16 ENCOUNTER — Encounter (HOSPITAL_BASED_OUTPATIENT_CLINIC_OR_DEPARTMENT_OTHER): Payer: Self-pay | Admitting: Cardiovascular Disease

## 2022-08-16 ENCOUNTER — Ambulatory Visit (HOSPITAL_BASED_OUTPATIENT_CLINIC_OR_DEPARTMENT_OTHER): Payer: No Typology Code available for payment source | Admitting: Cardiovascular Disease

## 2022-08-16 VITALS — BP 96/74 | HR 91 | Ht 61.0 in | Wt 200.0 lb

## 2022-08-16 DIAGNOSIS — R002 Palpitations: Secondary | ICD-10-CM

## 2022-08-16 DIAGNOSIS — I10 Essential (primary) hypertension: Secondary | ICD-10-CM | POA: Diagnosis not present

## 2022-08-16 DIAGNOSIS — K219 Gastro-esophageal reflux disease without esophagitis: Secondary | ICD-10-CM | POA: Diagnosis not present

## 2022-08-16 DIAGNOSIS — Z8249 Family history of ischemic heart disease and other diseases of the circulatory system: Secondary | ICD-10-CM

## 2022-08-16 MED ORDER — OLMESARTAN MEDOXOMIL-HCTZ 20-12.5 MG PO TABS: 1.0000 | ORAL_TABLET | Freq: Every day | ORAL | 3 refills | Status: AC

## 2022-08-16 NOTE — Progress Notes (Signed)
Cardiology Office Note:    Date:  08/16/2022   ID:  Michelle Larsen, DOB [birth year], MRN [id number]  PCP:  Harrison Mons, PA   [facility] Providers Cardiologist:  Skeet Latch, MD     Referring MD: Harrison Mons, PA   No chief complaint on file.   History of Present Illness:    Michelle Larsen is a 56 y.o. female with a hx of hypertension, GERD, and family hx of CAD, here for follow-up. She was initially seen 07/10/2021 as a referral for risk assessment. She was previously followed by [facility] in [place], [place]. An echo about 15 years ago revealed grade 1 diastolic dysfunction. Occasionally she has palpitations and chest pain, and Calcium Score 05/2020 was 0. She reported palpitations once or twice per week. Her blood pressure was above goal in office but had been controlled at home. Amlodipine 2.5 mg was added. She reported atypical chest pain but none with exertion. She also reported some shortness of breath and edema. She had an Echo 07/2021 with LVEF 60-65% and grade 1 diastolic dysfunction. RA pressure was 3 mmHG. She followed up with our pharmacy 07/2021 and blood pressure was better controlled. She was referred to the PREP exercise program.  She was seen in ED 01/2022 for chest pain that was thought to be non cardiac. At the last visit, se reported an episode of palpitations and chest pain. Her episodes were sporadic and we encouraged her to get a Kardia mobile device. Se was referred for a cardiac CT 02/2022 which showed no CAD and a calcium score was 0. 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 Lisinopril was increased due to uncontrolled blood pressure. She saw Laurann Montana, NP, and noted increased swelling after increasing lisinopril. She was switched to olmesartan, HCTZ.   Today, she states she has been doing pretty good. She states she is sometimes lightheaded and dizzy, typically about three times per week, but believes it is due to  dehydration. Right before falling asleep at night, she reports feeling some palpitations. Her at home blood pressure has been low. Some of her at home readings have been around the 073X - 106Y systolic. She has been walking daily for about an hour for her exercise. She states she walks about a mile and a half normally. She denies any chest pain, shortness of breath, or peripheral edema. No headaches, syncope, orthopnea, or PND.   Prior Antihypertensives: HCTZ Chlorthalidone Amlodipine - Chest discomfort  Past Medical History:  Diagnosis Date   Colon polyp    GERD (gastroesophageal reflux disease)    Palpitations 02/23/2022   Shortness of breath 07/10/2021    Past Surgical History:  Procedure Laterality Date   ABDOMINAL HYSTERECTOMY     BREAST REDUCTION SURGERY     CESAREAN SECTION     CHOLECYSTECTOMY     HERNIA REPAIR     x2    Current Medications: Current Meds  Medication Sig   lansoprazole (PREVACID) 30 MG capsule Take 1 capsule (30 mg total) by mouth daily at 12 noon.   loratadine (CLARITIN) 10 MG tablet Take 10 mg by mouth daily as needed.   [DISCONTINUED] olmesartan-hydrochlorothiazide (BENICAR HCT) 20-12.5 MG tablet Take 1 tablet by mouth daily.     Allergies:   Amlodipine, Chlorthalidone, Ceftin [cefuroxime], and Sulfa antibiotics   Social History   Socioeconomic History   Marital status: Married    Spouse name: Not on file   Number  of children: Not on file   Years of education: Not on file   Highest education level: Not on file  Occupational History   Not on file  Tobacco Use   Smoking status: Never   Smokeless tobacco: Never  Vaping Use   Vaping Use: Never used  Substance and Sexual Activity   Alcohol use: Not Currently   Drug use: Never   Sexual activity: Not on file  Other Topics Concern   Not on file  Social History Narrative   Not on file   Social Determinants of Health   Financial Resource Strain: Not on file  Food Insecurity: Not on file   Transportation Needs: Not on file  Physical Activity: Not on file  Stress: Not on file  Social Connections: Not on file     Family History: The patient's family history includes Atrial fibrillation in her mother; Colon cancer in her father; Heart disease in her paternal uncle; Heart failure in her maternal aunt and maternal uncle; Heart failure (age of onset: 64) in her maternal grandmother; Hypertension in her father and mother; Other in her father.  ROS:   Please see the history of present illness.    (+) Lightheadedness (+) Dizziness  All other systems reviewed and are negative.  EKGs/Labs/Other Studies Reviewed:    The following studies were reviewed today:  CT Coronary Morph 03/08/2022: FINDINGS: A 100 kV prospective scan was triggered in the descending thoracic aorta at 111 HU's. Axial non-contrast 3 mm slices were carried out through the heart. The data set was analyzed on a dedicated work station and scored using the [place]. Gantry rotation speed was 250 msecs and collimation was .6 mm. 0.8 mg of sl NTG was given. The 3D data set was reconstructed in 5% intervals of the 67-82 % of the R-R cycle. Diastolic phases were analyzed on a dedicated work station using MPR, MIP and VRT modes. The patient received 80 cc of contrast.   Image quality: good   Aorta:  Normal size.  No calcifications.  No dissection.   Aortic Valve:  Trileaflet.  No calcifications.   Coronary Arteries:  Normal coronary origin.  Right dominance.   RCA is a large dominant artery that gives rise to PDA and PLA. There is no plaque.   Left main is a large, short artery that gives rise to LAD and LCX arteries.   LAD is a large vessel that has no plaque.   LCX is a non-dominant artery that gives rise to two large OM branches. There is no plaque.   Other findings:   Normal pulmonary vein drainage into the left atrium.   Normal left atrial appendage without a thrombus.   Mildly dilated  pulmonary artery (31 mm).   Please see radiology report for non cardiac findings.   IMPRESSION: 1. Coronary calcium score of 0. This was 0 percentile for age and sex matched control.   2. Normal coronary origin with right dominance.   3. No evidence of CAD.   4. 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   Echo 07/28/2021:  1. Left ventricular ejection fraction, by estimation, is 60 to 65%. The  left ventricle has normal function. The left ventricle has no regional  wall motion abnormalities. Left ventricular diastolic parameters are  consistent with Grade I diastolic dysfunction (impaired relaxation). The average left ventricular global longitudinal strain is -24.7 %. The global longitudinal strain is normal.   2. Right ventricular systolic function is normal. The right ventricular  size is normal. Tricuspid regurgitation signal is inadequate for assessing PA pressure.   3. The mitral valve is normal in structure. No evidence of mitral valve  regurgitation. No evidence of mitral stenosis.   4. The aortic valve is tricuspid. Aortic valve regurgitation is not  visualized. No aortic stenosis is present.   5. The inferior vena cava is normal in size with greater than 50%  respiratory variability, suggesting right atrial pressure of 3 mmHg.   Calcium Score ([facility]) 06/02/2020: TOTAL CALCIUM SCORE :   0   CARDIAC ANATOMY:Normal.   VISUALIZED THORAX:The visualized mediastinum, lungs, and osseous structures are unremarkable. Hepatic cysts are noted within the visualized upper abdomen.   IMPRESSION: The total coronary calcium score is 0.   EKG: EKG is personally reviewed.  08/16/22: EKG was not ordered.   02/23/2022: Sinus rhythm, rate 74 bpm; non-specific T-wave abnormalities 11/11/2021: Sinus rhythm. Rate 70 bpm. 07/10/2021: Sinus rhythm. Rate 91 bpm. Left atrial enlargement.  Recent Labs: 02/21/2022: ALT 13; B Natriuretic Peptide 18.9 06/30/2022: BUN 18; Creatinine, Ser 1.11; Hemoglobin  12.1; Magnesium 2.2; Platelets 260; Potassium 4.6; Sodium 142   Recent Lipid Panel    Component Value Date/Time   CHOL 180 11/12/2021 0804   TRIG 99 11/12/2021 0804   HDL 41 11/12/2021 0804   CHOLHDL 4.4 11/12/2021 0804   LDLCALC 121 (H) 11/12/2021 0804         Physical Exam:    VS:  BP 96/74   Pulse 91   Ht '5\' 1"'$  (1.549 m)   Wt 200 lb (90.7 kg)   BMI 37.79 kg/m  , BMI Body mass index is 37.79 kg/m. GENERAL:  Well appearing HEENT: Pupils equal round and reactive, fundi not visualized, oral mucosa unremarkable NECK:  No jugular venous distention, waveform within normal limits, carotid upstroke brisk and symmetric, no bruits LUNGS:  Clear to auscultation bilaterally HEART:  RRR.  PMI not displaced or sustained,S1 and S2 within normal limits, no S3, no S4, no clicks, no rubs, no murmurs ABD:  Flat, positive bowel sounds normal in frequency in pitch, no bruits, no rebound, no guarding, no midline pulsatile mass, no hepatomegaly, no splenomegaly EXT:  2 plus pulses throughout, no edema, no cyanosis no clubbing SKIN:  No rashes no nodules NEURO:  Cranial nerves II through XII grossly intact, motor grossly intact throughout PSYCH:  Cognitively intact, oriented to person place and time  ASSESSMENT:    1. Gastroesophageal reflux disease without esophagitis   2. Essential hypertension   3. Palpitations   4. Family history of heart disease       PLAN:    Essential hypertension BP at home has been well-controlled.  She has experienced lightheadedness about 3 times per week.  She is going to cut her olmesartan/HCTZ tablets in half and continue checking her blood pressures at home to make sure that it stays less than 130/80.  Encouraged her to increase the pace of her exercise and continue watching her diet.  Gastroesophageal reflux disease She had recent upper and lower endoscopies.  Symptoms well controlled on PPI.  Palpitations She continues to have some palpitations when  she lies in bed at night but no exertional symptoms.  Continue to monitor.  She is dealing with a lot of lately from stress of moving.  Kardia mobile device recommended.  Family history of heart disease She does have a strong family history of CAD.  However she had no coronary artery disease on her cardiac CT.  It  is important for her to work on lifestyle modification including continue to exercise, continue to maintain her blood pressure less than 130/80, watching her diet, and weight loss.  However I do not think she needs regular cardiology follow-up.  She certainly can follow-up should she have new symptoms arise.   Disposition: No follow up due to move   Medication Adjustments/Labs and Tests Ordered: Current medicines are reviewed at length with the patient today.  Concerns regarding medicines are outlined above.   No orders of the defined types were placed in this encounter.   Meds ordered this encounter  Medications   olmesartan-hydrochlorothiazide (BENICAR HCT) 20-12.5 MG tablet    Sig: Take 1 tablet by mouth daily.    Dispense:  90 tablet    Refill:  3    Patient Instructions  Medication Instructions:  DECREASE YOUR OLMESARTAN HCT TO 1/2 TABLET DAILY. MONITOR YOUR BLOOD PRESSURE AND IF IT STARTS GOING ABOVE 130/80 GO BACK TO FULL TABLET DAILY   Labwork: NONE  Testing/Procedures: NONE  Follow-Up: AS NEEDED     I,Breanna Adamick,acting as a scribe for Skeet Latch, MD.,have documented all relevant documentation on the behalf of Skeet Latch, MD,as directed by  Skeet Latch, MD while in the presence of Skeet Latch, MD.   I, Montverde Oval Linsey, MD have reviewed all documentation for this visit.  The documentation of the exam, diagnosis, procedures, and orders on 08/16/2022 are all accurate and complete.   Signed, Skeet Latch, MD  08/16/2022 8:26 AM    [facility]

## 2022-08-16 NOTE — Patient Instructions (Signed)
Medication Instructions:  DECREASE YOUR OLMESARTAN HCT TO 1/2 TABLET DAILY. MONITOR YOUR BLOOD PRESSURE AND IF IT STARTS GOING ABOVE 130/80 GO BACK TO FULL TABLET DAILY   Labwork: NONE  Testing/Procedures: NONE  Follow-Up: AS NEEDED

## 2022-08-16 NOTE — Assessment & Plan Note (Signed)
She does have a strong family history of CAD.  However she had no coronary artery disease on her cardiac CT.  It is important for her to work on lifestyle modification including continue to exercise, continue to maintain her blood pressure less than 130/80, watching her diet, and weight loss.  However I do not think she needs regular cardiology follow-up.  She certainly can follow-up should she have new symptoms arise.

## 2022-08-16 NOTE — Assessment & Plan Note (Signed)
She continues to have some palpitations when she lies in bed at night but no exertional symptoms.  Continue to monitor.  She is dealing with a lot of lately from stress of moving.  Kardia mobile device recommended.

## 2022-08-16 NOTE — Assessment & Plan Note (Signed)
She had recent upper and lower endoscopies.  Symptoms well controlled on PPI.

## 2022-08-16 NOTE — Assessment & Plan Note (Signed)
BP at home has been well-controlled.  She has experienced lightheadedness about 3 times per week.  She is going to cut her olmesartan/HCTZ tablets in half and continue checking her blood pressures at home to make sure that it stays less than 130/80.  Encouraged her to increase the pace of her exercise and continue watching her diet.

## 2022-08-18 NOTE — Telephone Encounter (Signed)
Addressed at clinic visit 07/2022   Loel Dubonnet, NP

## 2023-01-10 IMAGING — DX DG CHEST 1V PORT
1 series · 1 of 1 positions shown · non-contrast
Comparison: None.

CLINICAL DATA: Chest pressure, nausea, left upper extremity pain
and dizziness.

EXAM:
PORTABLE CHEST 1 VIEW

[chest ap]
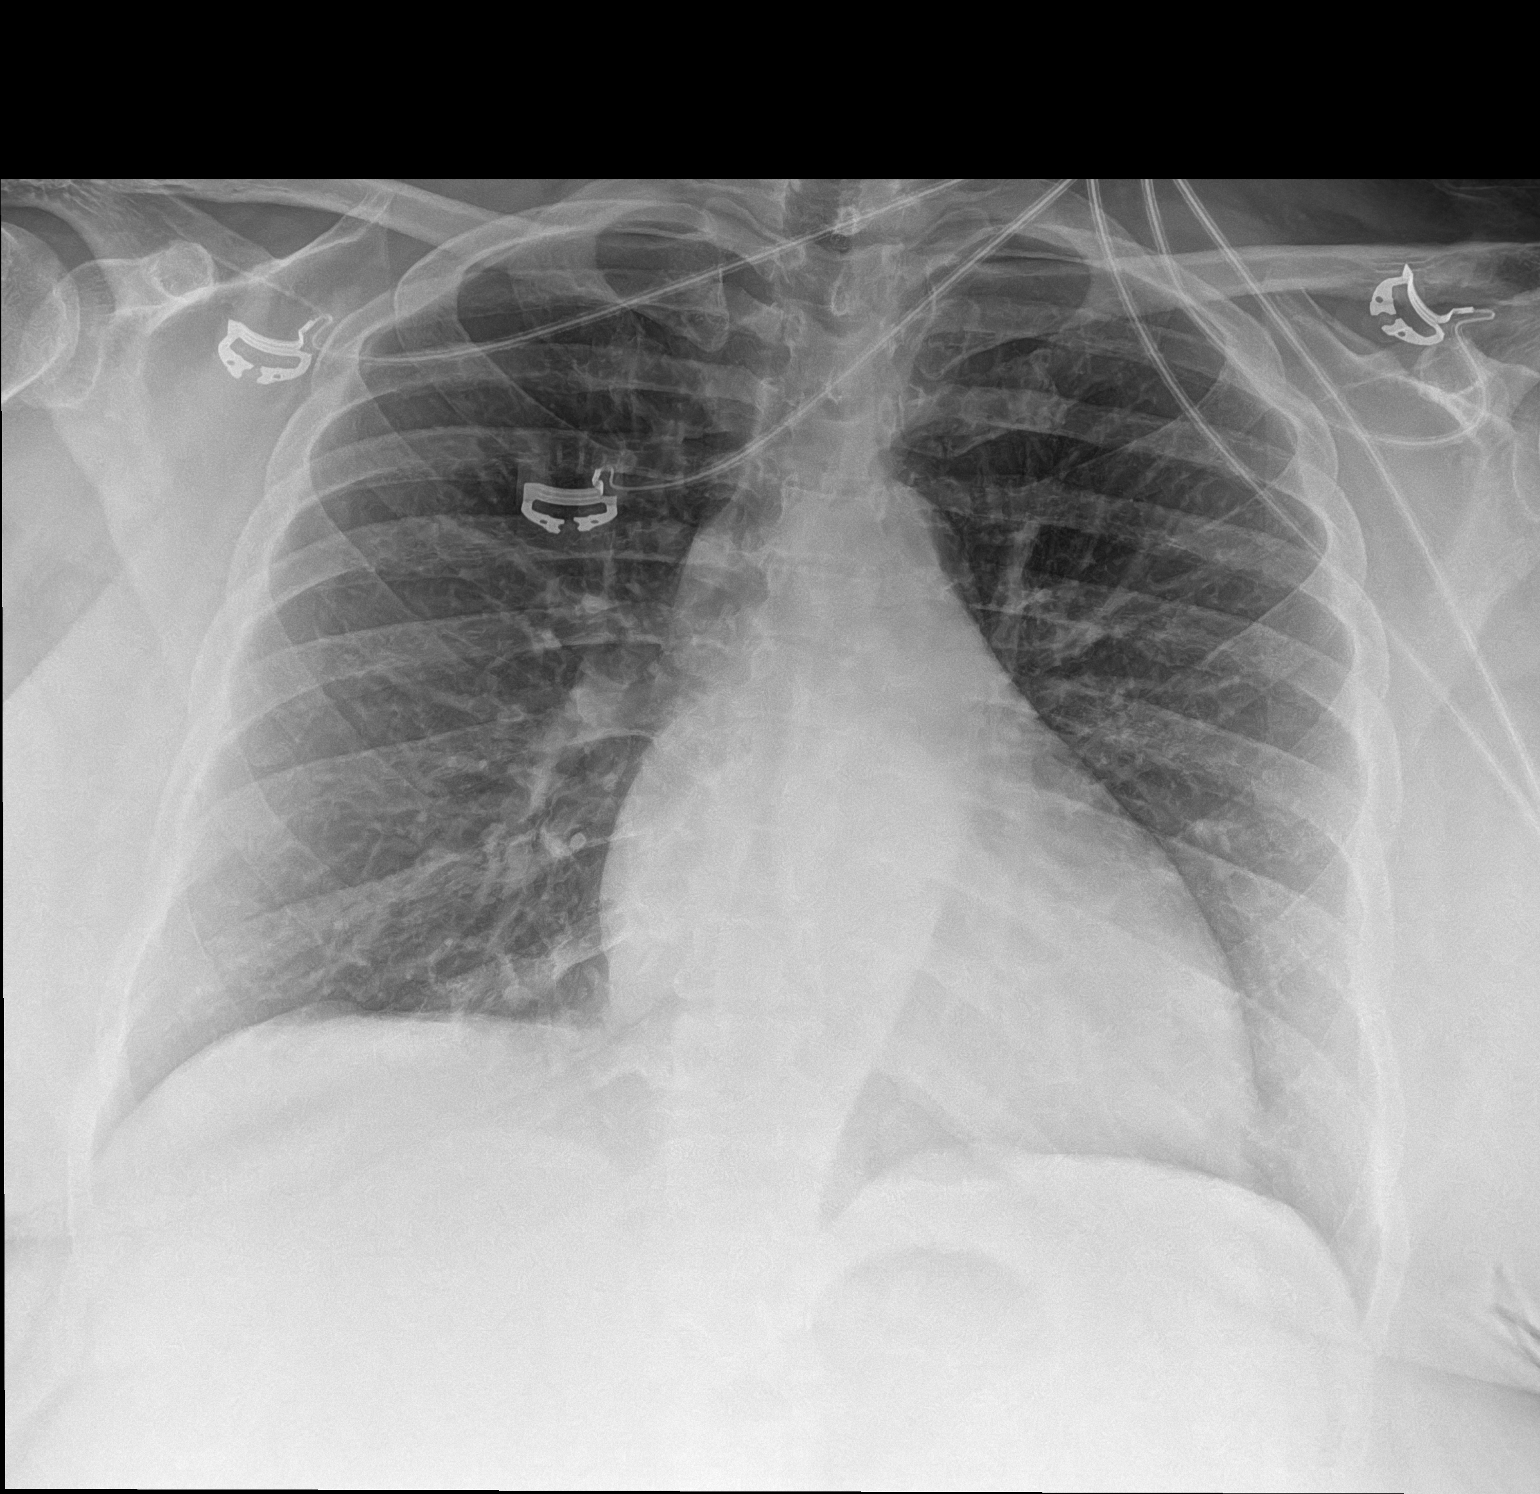

[1 of 1 positions shown; findings below may reference images not displayed]

FINDINGS: The heart size and mediastinal contours are within normal limits
apart from slight aortic tortuosity. Both lungs are clear. The
visualized skeletal structures are unremarkable.
IMPRESSION: No evidence of acute chest disease.

## 2023-01-25 IMAGING — CT CT HEART MORP W/ CTA COR W/ SCORE W/ CA W/CM &/OR W/O CM
4 of 7 series · 8 of 20 positions shown, 9 images · IV contrast (APPLIED)
Comparison: None.
COMPARISON: None.

Addendum:
EXAM:
OVER-READ INTERPRETATION  CT CHEST

The following report is an over-read performed by radiologist Dr.
Bertwin Rudolph [[email or web address]] on 03/08/2022. This
over-read does not include interpretation of cardiac or coronary
anatomy or pathology. The coronary CTA interpretation by the
cardiologist is attached.
CLINICAL DATA: 55 year old female with chest pain
Cardiac/Coronary  CTA
TECHNIQUE: The patient was scanned on a [employer] scanner.

[Series 6: best diast · axial · 0.39mm/px · z∈[+1190,+1225]mm · 2 of 258 slices shown, 3 images]
[im 86/258  vessel]
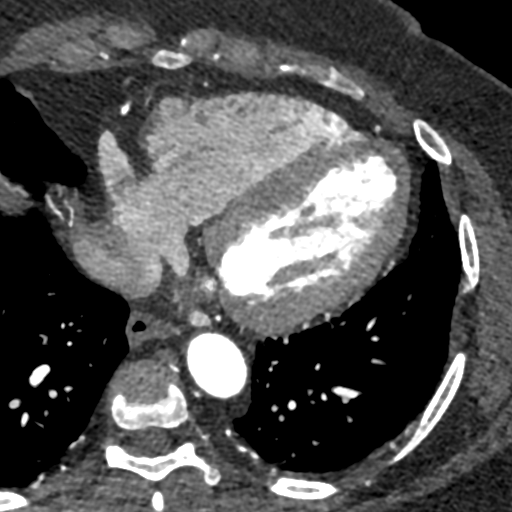
[im 86/258  lung]
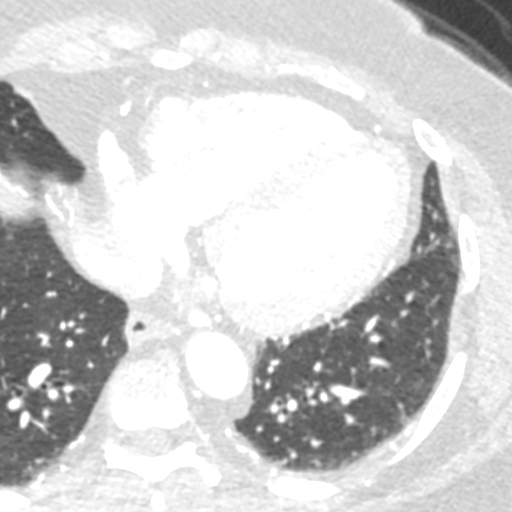
[im 172/258  vessel]
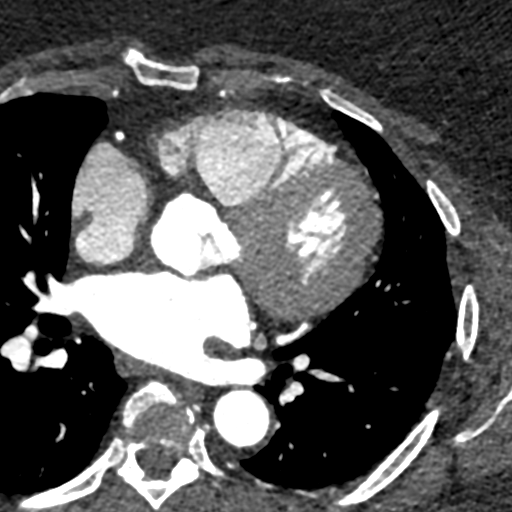

[Series 7: ts diast sharp · axial · 0.39mm/px · z∈[+1190,+1225]mm · 2 of 258 slices shown]
[im 86/258  lung]
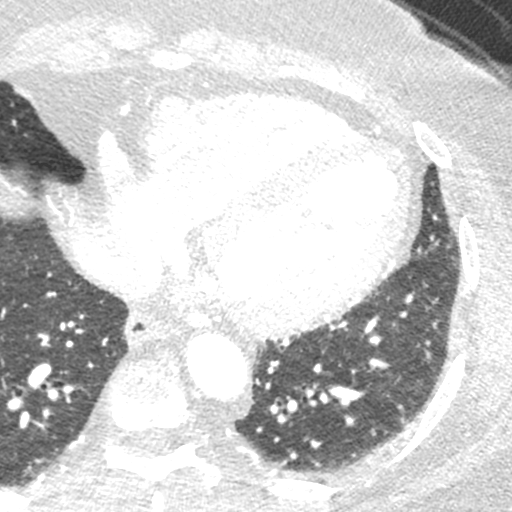
[im 172/258  lung]
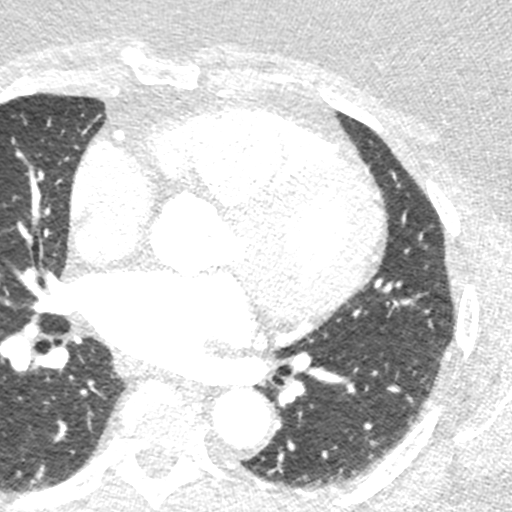

[Series 8: best syst · axial · 0.39mm/px · z∈[+1190,+1225]mm · 2 of 258 slices shown]
[im 86/258  vessel]
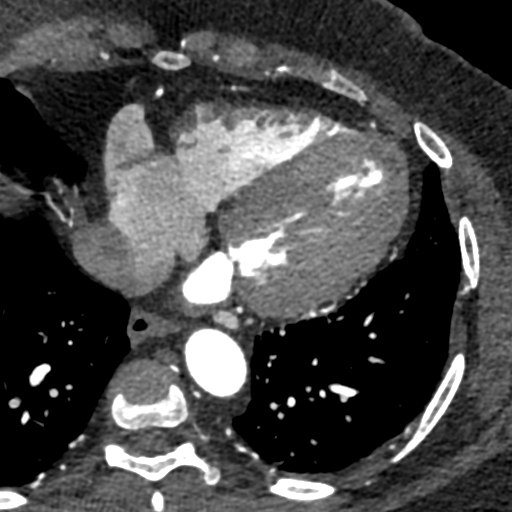
[im 172/258  vessel]
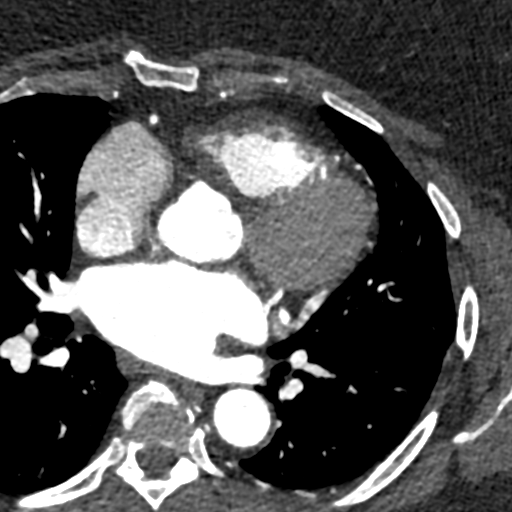

[Series 9: ts syst sharp · axial · 0.39mm/px · z∈[+1190,+1225]mm · 2 of 258 slices shown]
[im 86/258  lung]
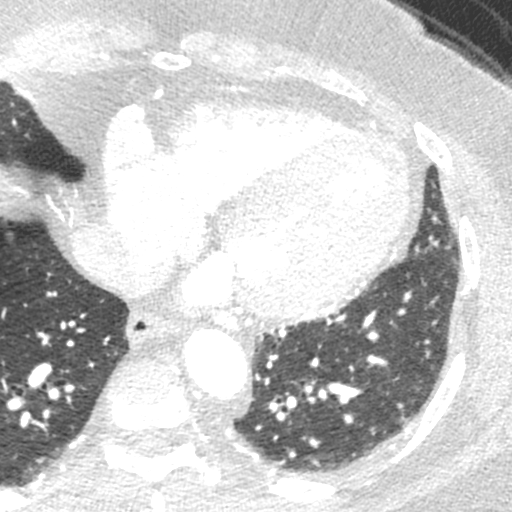
[im 172/258  lung]
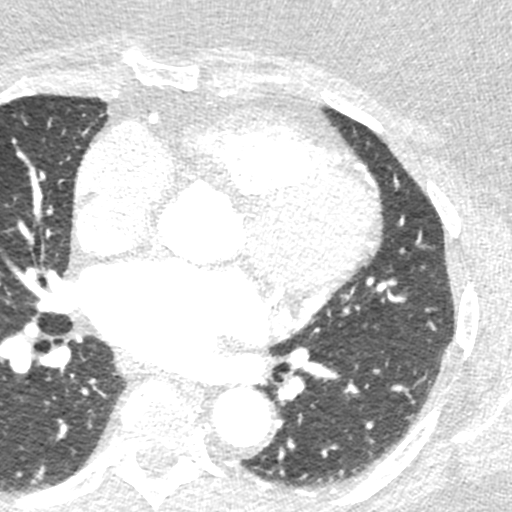

[8 of 20 positions shown; findings below may reference images not displayed]

FINDINGS: Vascular: The main pulmonary artery is mildly dilated measuring up
to approximately 3.1 cm in diameter. However, right and left
pulmonary arteries do not appear dilated.

Mediastinum/Nodes: Visualized mediastinum and hilar regions
demonstrate no lymphadenopathy or masses.

Lungs/Pleura: There is no evidence of pulmonary edema,
consolidation, pneumothorax, nodule or pleural fluid.

Upper Abdomen: Adjacent benign-appearing simple cysts in the
superior right lobe measure approximately 3.7 cm and 2.6 cm in
respective maximal diameter.

Musculoskeletal: No chest wall mass or suspicious bone lesions
identified.
IMPRESSION: 1. Mildly prominent main pulmonary artery diameter without visible
extension pulmonary arterial dilatation into right and left
pulmonary arteries. This is a nonspecific finding.
2. Simple cysts in the visualized right lobe of the liver.
FINDINGS: A 100 kV prospective scan was triggered in the descending thoracic
aorta at 111 HU's. Axial non-contrast 3 mm slices were carried out
through the heart. The data set was analyzed on a dedicated work
station and scored using the Agatson method. Gantry rotation speed
was 250 msecs and collimation was .6 mm. 0.8 mg of sl NTG was given.
The 3D data set was reconstructed in 5% intervals of the 67-82 % of
the R-R cycle. Diastolic phases were analyzed on a dedicated work
station using MPR, MIP and VRT modes. The patient received 80 cc of
contrast.

Image quality: good

Aorta:  Normal size.  No calcifications.  No dissection.

Aortic Valve:  Trileaflet.  No calcifications.

Coronary Arteries:  Normal coronary origin.  Right dominance.

RCA is a large dominant artery that gives rise to PDA and PLA. There
is no plaque.

Left main is a large, short artery that gives rise to LAD and LCX
arteries.

LAD is a large vessel that has no plaque.

LCX is a non-dominant artery that gives rise to two large OM
branches. There is no plaque.

Other findings:

Normal pulmonary vein drainage into the left atrium.

Normal left atrial appendage without a thrombus.

Mildly dilated pulmonary artery (31 mm).

Please see radiology report for non cardiac findings.
IMPRESSION: 1. Coronary calcium score of 0. This was 0 percentile for age and
sex matched control.

2. Normal coronary origin with right dominance.

3. No evidence of CAD.

4. Mildly dilated pulmonary artery (31 mm).

*** End of Addendum ***
EXAM:
OVER-READ INTERPRETATION  CT CHEST

The following report is an over-read performed by radiologist Dr.
Bertwin Rudolph [REDACTED] on 03/08/2022. This
over-read does not include interpretation of cardiac or coronary
anatomy or pathology. The coronary CTA interpretation by the
cardiologist is attached.
FINDINGS: Vascular: The main pulmonary artery is mildly dilated measuring up
to approximately 3.1 cm in diameter. However, right and left
pulmonary arteries do not appear dilated.

Mediastinum/Nodes: Visualized mediastinum and hilar regions
demonstrate no lymphadenopathy or masses.

Lungs/Pleura: There is no evidence of pulmonary edema,
consolidation, pneumothorax, nodule or pleural fluid.

Upper Abdomen: Adjacent benign-appearing simple cysts in the
superior right lobe measure approximately 3.7 cm and 2.6 cm in
respective maximal diameter.

Musculoskeletal: No chest wall mass or suspicious bone lesions
identified.
IMPRESSION: 1. Mildly prominent main pulmonary artery diameter without visible
extension pulmonary arterial dilatation into right and left
pulmonary arteries. This is a nonspecific finding.
2. Simple cysts in the visualized right lobe of the liver.

## 2023-02-28 ENCOUNTER — Encounter: Payer: Self-pay | Admitting: Gastroenterology

## 2023-07-14 ENCOUNTER — Encounter: Payer: Self-pay | Admitting: Gastroenterology
